# Patient Record
Sex: Female | Born: 1935 | Race: Black or African American | Hispanic: No | Marital: Single | Smoking: Never smoker
Health system: Southern US, Community
[De-identification: ages and names within clinical notes are randomized; demographics above are authoritative.]

## PROBLEM LIST (undated history)

## (undated) DIAGNOSIS — C801 Malignant (primary) neoplasm, unspecified: Secondary | ICD-10-CM

## (undated) DIAGNOSIS — F0390 Unspecified dementia without behavioral disturbance: Secondary | ICD-10-CM

## (undated) DIAGNOSIS — N182 Chronic kidney disease, stage 2 (mild): Secondary | ICD-10-CM

## (undated) DIAGNOSIS — K069 Disorder of gingiva and edentulous alveolar ridge, unspecified: Secondary | ICD-10-CM

## (undated) DIAGNOSIS — I639 Cerebral infarction, unspecified: Secondary | ICD-10-CM

## (undated) DIAGNOSIS — I10 Essential (primary) hypertension: Secondary | ICD-10-CM

## (undated) HISTORY — DX: Disorder of gingiva and edentulous alveolar ridge, unspecified: K06.9

## (undated) HISTORY — DX: Malignant (primary) neoplasm, unspecified: C80.1

---

## 2007-06-15 ENCOUNTER — Emergency Department: Payer: Self-pay | Admitting: Emergency Medicine

## 2007-06-15 ENCOUNTER — Other Ambulatory Visit: Payer: Self-pay

## 2011-02-11 HISTORY — PX: BREAST BIOPSY: SHX20

## 2011-11-08 ENCOUNTER — Emergency Department: Payer: Self-pay | Admitting: Emergency Medicine

## 2011-11-08 LAB — COMPREHENSIVE METABOLIC PANEL
Albumin: 3.5 g/dL (ref 3.4–5.0)
Alkaline Phosphatase: 181 U/L — ABNORMAL HIGH (ref 50–136)
Bilirubin,Total: 0.9 mg/dL (ref 0.2–1.0)
Chloride: 103 mmol/L (ref 98–107)
Co2: 30 mmol/L (ref 21–32)
Creatinine: 0.71 mg/dL (ref 0.60–1.30)
EGFR (Non-African Amer.): >60
Osmolality: 284 (ref 275–301)
SGOT(AST): 22 U/L (ref 15–37)
SGPT (ALT): 11 U/L — ABNORMAL LOW (ref 12–78)
Sodium: 142 mmol/L (ref 136–145)
Total Protein: 8.9 g/dL — ABNORMAL HIGH (ref 6.4–8.2)

## 2011-11-08 LAB — CBC
HGB: 13 g/dL (ref 12.0–16.0)
MCH: 29.4 pg (ref 26.0–34.0)
MCHC: 33.5 g/dL (ref 32.0–36.0)
Platelet: 244 10*3/uL (ref 150–440)
RBC: 4.41 10*6/uL (ref 3.80–5.20)
RDW: 14.5 % (ref 11.5–14.5)
WBC: 7.3 10*3/uL (ref 3.6–11.0)

## 2011-11-11 DIAGNOSIS — C801 Malignant (primary) neoplasm, unspecified: Secondary | ICD-10-CM

## 2011-11-11 HISTORY — DX: Malignant (primary) neoplasm, unspecified: C80.1

## 2012-01-11 HISTORY — PX: PORTACATH PLACEMENT: SHX2246

## 2012-02-11 HISTORY — PX: OTHER SURGICAL HISTORY: SHX169

## 2012-07-08 ENCOUNTER — Ambulatory Visit: Payer: Self-pay | Admitting: Hematology and Oncology

## 2012-07-13 ENCOUNTER — Ambulatory Visit: Payer: Self-pay | Admitting: Hematology and Oncology

## 2012-07-13 ENCOUNTER — Ambulatory Visit: Payer: Self-pay | Admitting: Radiation Oncology

## 2012-07-14 LAB — CBC CANCER CENTER
Eosinophil %: 3.4 %
Lymphocyte #: 1.3 x10 3/mm (ref 1.0–3.6)
Lymphocyte %: 16.5 %
MCHC: 33.7 g/dL (ref 32.0–36.0)
Monocyte #: 0.6 x10 3/mm (ref 0.2–0.9)
Monocyte %: 7.2 %
Neutrophil #: 5.5 x10 3/mm (ref 1.4–6.5)
Neutrophil %: 71.9 %
RDW: 14.2 % (ref 11.5–14.5)
WBC: 7.7 x10 3/mm (ref 3.6–11.0)

## 2012-07-14 LAB — COMPREHENSIVE METABOLIC PANEL
Albumin: 3.1 g/dL — ABNORMAL LOW (ref 3.4–5.0)
Alkaline Phosphatase: 141 U/L — ABNORMAL HIGH (ref 50–136)
Bilirubin,Total: 0.2 mg/dL (ref 0.2–1.0)
Calcium, Total: 9.5 mg/dL (ref 8.5–10.1)
Chloride: 104 mmol/L (ref 98–107)
Co2: 32 mmol/L (ref 21–32)
Creatinine: 1.26 mg/dL (ref 0.60–1.30)
EGFR (African American): 47 — ABNORMAL LOW
Osmolality: 283 (ref 275–301)
SGOT(AST): 18 U/L (ref 15–37)
SGPT (ALT): 14 U/L (ref 12–78)
Sodium: 140 mmol/L (ref 136–145)
Total Protein: 8.8 g/dL — ABNORMAL HIGH (ref 6.4–8.2)

## 2012-07-15 ENCOUNTER — Ambulatory Visit: Payer: Self-pay | Admitting: Hematology and Oncology

## 2012-07-20 ENCOUNTER — Ambulatory Visit (INDEPENDENT_AMBULATORY_CARE_PROVIDER_SITE_OTHER): Payer: Medicare Other | Admitting: General Surgery

## 2012-07-20 ENCOUNTER — Other Ambulatory Visit: Payer: Self-pay | Admitting: General Surgery

## 2012-07-20 ENCOUNTER — Encounter: Payer: Self-pay | Admitting: General Surgery

## 2012-07-20 VITALS — BP 148/74 | HR 60 | Resp 12 | Ht 61.0 in | Wt 107.0 lb

## 2012-07-20 DIAGNOSIS — C801 Malignant (primary) neoplasm, unspecified: Secondary | ICD-10-CM

## 2012-07-20 DIAGNOSIS — C50919 Malignant neoplasm of unspecified site of unspecified female breast: Secondary | ICD-10-CM

## 2012-07-20 DIAGNOSIS — C50911 Malignant neoplasm of unspecified site of right female breast: Secondary | ICD-10-CM

## 2012-07-20 NOTE — Progress Notes (Signed)
Patient ID: Sonya Bowman, female   DOB: [birth year], 79 y.o.   MRN: [id number]  Chief Complaint  Patient presents with  . Breast Problem    HPI Sonya Bowman is a 79 y.o. female who presents for an evaluation for right breast cancer diagnosed in Oct 2013. Referred by Dr Wendie Simmer.  Originally followed by Dr Wilford Grist [facility] and recently transferred to [facility] "closer for family".  Was trying chemotherapy to try and shrink the breast tumor but it doesn't seem to be working. Has left chest port a cath. Daughter present at visit. Daughter changing her right breast dressing every 2-3 days. Daughter does states her mom has had a weight loss of about 40 pounds over past year.  The patient's daughter, Sonya Bowman, had been unaware of the breast mass. Her mother had been living alone a couple of miles from her, but they are now living together. The daughter reports that her mother tolerated her chemotherapy well. Her weight loss predated the diagnosis of her cancer. The patient reports no difficulty with her appetite, but her daughter reports that she is eating less than she was a year or so ago. HPI  Past Medical History  Diagnosis Date  . Gum disease   . Cancer Oct 2013    right breast    Past Surgical History  Procedure Laterality Date  . Breast biopsy Right 2013  . Portacath placement Left Dec 2013    [facility]    History reviewed. No pertinent family history.  Social History History  Substance Use Topics  . Smoking status: Never Smoker   . Smokeless tobacco: Current User    Types: Snuff  . Alcohol Use: No    No Known Allergies  Current Outpatient Prescriptions  Medication Sig Dispense Refill  . cloNIDine (CATAPRES) 0.2 MG tablet Take 0.2 mg by mouth 2 (two) times daily.      Marland Kitchen loperamide (IMODIUM) 2 MG capsule Take 2 mg by mouth as needed for diarrhea or loose stools.      Bernadette Hoit Sodium (STOOL SOFTENER & LAXATIVE PO) Take by mouth.        No current facility-administered medications for this visit.    Review of Systems Review of Systems  Constitutional: Positive for chills.  Cardiovascular: Negative.     Blood pressure 148/74, pulse 60, resp. rate 12, height 5\' 1"  (1.549 m), weight 107 lb (48.535 kg).  Physical Exam Physical Exam  Constitutional: She is oriented to person, place, and time. She appears well-developed.  Cardiovascular: Normal rate and regular rhythm.   Pulmonary/Chest: Effort normal and breath sounds normal. Right breast exhibits mass. Left breast exhibits mass. Left breast exhibits no inverted nipple, no nipple discharge, no skin change and no tenderness.  Lymphadenopathy:    She has no cervical adenopathy.    She has axillary adenopathy.       Right axillary: Lateral adenopathy present.  Neurological: She is alert and oriented to person, place, and time.  Skin: Skin is warm and dry.   Left breast 5 mm nodule at 4 o'clock 9 cm right breast mass with a 1 cm skin ulceration at 10 o'clock with scant straw-colored drainage. Palpable right axillary nodes   Data Reviewed Pathology report reported stage IV breast cancer with posterior cervical adenopathy, low ER, low PR and not HER-2/neu over amplified. Bone scan was negative for metastatic bone disease.  Assessment    Advanced breast cancer with skin ulceration.     Plan  The indication for toilet mastectomy and lymphadenectomy to decrease her tumor volume was reviewed with the patient and her daughter. The risks of surgery associated with anesthesia, bleeding, infection pain were discussed. If she does as well with surgery as she has with her chemotherapy I would anticipate this will be completed as an outpatient procedure.     Patient's surgery has been scheduled for 07-29-12 at [facility].   Sonya Bowman 07/20/2012, 8:41 PM

## 2012-07-20 NOTE — Patient Instructions (Addendum)
The patient is aware to call back for any questions or concerns.  Right mastectomy same day surgery, risk and benefits reviewed.  Patient's surgery has been scheduled for 07-29-12 at [address] [facility].

## 2012-07-22 ENCOUNTER — Ambulatory Visit: Payer: Self-pay | Admitting: General Surgery

## 2012-07-26 ENCOUNTER — Ambulatory Visit: Payer: Self-pay | Admitting: Hematology and Oncology

## 2012-07-29 ENCOUNTER — Encounter: Payer: Self-pay | Admitting: General Surgery

## 2012-07-29 ENCOUNTER — Ambulatory Visit: Payer: Self-pay | Admitting: General Surgery

## 2012-07-29 DIAGNOSIS — C50419 Malignant neoplasm of upper-outer quadrant of unspecified female breast: Secondary | ICD-10-CM

## 2012-07-29 HISTORY — PX: BREAST SURGERY: SHX581

## 2012-08-03 ENCOUNTER — Ambulatory Visit (INDEPENDENT_AMBULATORY_CARE_PROVIDER_SITE_OTHER): Payer: Medicaid Other | Admitting: General Surgery

## 2012-08-03 ENCOUNTER — Encounter: Payer: Self-pay | Admitting: General Surgery

## 2012-08-03 VITALS — BP 118/66 | HR 62 | Resp 12 | Ht 61.0 in | Wt 106.0 lb

## 2012-08-03 DIAGNOSIS — C50911 Malignant neoplasm of unspecified site of right female breast: Secondary | ICD-10-CM

## 2012-08-03 DIAGNOSIS — C50919 Malignant neoplasm of unspecified site of unspecified female breast: Secondary | ICD-10-CM

## 2012-08-03 NOTE — Progress Notes (Signed)
Patient ID: Sonya Bowman, female   DOB: 1930-06-03, 79 y.o.   MRN: [id number]  Chief Complaint  Patient presents with  . Routine Post Op    right mastectomy    HPI Sonya Bowman is a 79 y.o. female here today for her post op right mastectomy done 07/29/12. No complaints and states "doing good".  She is accompanied today by her daughter. Drainage volumes are still running 45-60 cc per day. The patient reports essentially no pain.  HPI  Past Medical History  Diagnosis Date  . Gum disease   . Cancer Oct 2013    right breast    Past Surgical History  Procedure Laterality Date  . Portacath placement Left Dec 2013    [facility]  . Breast biopsy Right 2013  . Breast surgery Right 2014    mastectomy    No family history on file.  Social History History  Substance Use Topics  . Smoking status: Never Smoker   . Smokeless tobacco: Current User    Types: Snuff  . Alcohol Use: No    No Known Allergies  Current Outpatient Prescriptions  Medication Sig Dispense Refill  . cloNIDine (CATAPRES) 0.2 MG tablet Take 0.2 mg by mouth 2 (two) times daily.      Marland Kitchen loperamide (IMODIUM) 2 MG capsule Take 2 mg by mouth as needed for diarrhea or loose stools.      Bernadette Hoit Sodium (STOOL SOFTENER & LAXATIVE PO) Take by mouth.       No current facility-administered medications for this visit.    Review of Systems Review of Systems  Constitutional: Negative.   Respiratory: Negative.   Cardiovascular: Negative.     Blood pressure 118/66, pulse 62, resp. rate 12, height 5\' 1"  (1.549 m), weight 106 lb (48.081 kg).  Physical Exam Physical Exam  Constitutional: She is oriented to person, place, and time. She appears well-developed and well-nourished.  Pulmonary/Chest:  Right mastectomy healing well.  Neurological: She is alert and oriented to person, place, and time.  Skin: Skin is warm and dry.  Right shoulder range of motion is quite good. No evidence of peripheral edema in  the upper extremity.  Data Reviewed Pathology showed invasive mammary carcinoma no special type measuring 9.5 cm in greatest diameter with skin ulceration. Histologic grade 3. 15 lymph nodes negative for malignancy, 3 of 15 lymph nodes with extensive necrosis consistent with treatment response. yP4b,ypN0  Assessment    Doing well post right modified radical mastectomy.    Plan    Arrangements are in place for followup exam in one week. A Tegaderm dressing was applied to the wound. The patient may shower but was discouraged from soaking the chest in the tub.       Sonya Bowman 08/04/2012, 7:33 PM

## 2012-08-03 NOTE — Patient Instructions (Addendum)
Patient to return one week  

## 2012-08-04 ENCOUNTER — Encounter: Payer: Self-pay | Admitting: General Surgery

## 2012-08-10 ENCOUNTER — Ambulatory Visit: Payer: Self-pay | Admitting: Hematology and Oncology

## 2012-08-10 ENCOUNTER — Ambulatory Visit: Payer: Self-pay | Admitting: Radiation Oncology

## 2012-08-10 LAB — CBC CANCER CENTER
Eosinophil #: 0.4 x10 3/mm (ref 0.0–0.7)
HCT: 34.8 % — ABNORMAL LOW (ref 35.0–47.0)
Lymphocyte %: 15.5 %
MCHC: 33.9 g/dL (ref 32.0–36.0)
MCV: 88 fL (ref 80–100)
Neutrophil #: 5.4 x10 3/mm (ref 1.4–6.5)
Neutrophil %: 70.7 %
RBC: 3.95 10*6/uL (ref 3.80–5.20)

## 2012-08-10 LAB — COMPREHENSIVE METABOLIC PANEL
Albumin: 3.1 g/dL — ABNORMAL LOW (ref 3.4–5.0)
Anion Gap: 4 — ABNORMAL LOW (ref 7–16)
BUN: 19 mg/dL — ABNORMAL HIGH (ref 7–18)
Calcium, Total: 9.1 mg/dL (ref 8.5–10.1)
EGFR (African American): 59 — ABNORMAL LOW
Glucose: 83 mg/dL (ref 65–99)
Osmolality: 288 (ref 275–301)
Potassium: 4 mmol/L (ref 3.5–5.1)
SGPT (ALT): 14 U/L (ref 12–78)
Total Protein: 7.7 g/dL (ref 6.4–8.2)

## 2012-08-11 ENCOUNTER — Ambulatory Visit (INDEPENDENT_AMBULATORY_CARE_PROVIDER_SITE_OTHER): Payer: Medicaid Other | Admitting: General Surgery

## 2012-08-11 ENCOUNTER — Encounter: Payer: Self-pay | Admitting: General Surgery

## 2012-08-11 VITALS — BP 120/68 | HR 64 | Resp 14 | Ht 61.0 in | Wt 110.0 lb

## 2012-08-11 DIAGNOSIS — C50911 Malignant neoplasm of unspecified site of right female breast: Secondary | ICD-10-CM

## 2012-08-11 DIAGNOSIS — C50919 Malignant neoplasm of unspecified site of unspecified female breast: Secondary | ICD-10-CM

## 2012-08-11 NOTE — Progress Notes (Deleted)
Patient ID: Sonya Bowman, female   DOB: Oct 30, 1930, 79 y.o.   MRN: [id number]  Chief Complaint  Patient presents with  . Routine Post Op    right breast mastectomy    HPI Sonya Bowman is a 79 y.o. female here today for her post op right breast mastectomy done on 07/29/12.  HPI  Past Medical History  Diagnosis Date  . Gum disease   . Cancer Oct 2013    right breast    Past Surgical History  Procedure Laterality Date  . Portacath placement Left Dec 2013    [facility]  . Breast biopsy Right 2013  . Breast surgery Right 2014    mastectomy    No family history on file.  Social History History  Substance Use Topics  . Smoking status: Never Smoker   . Smokeless tobacco: Current User    Types: Snuff  . Alcohol Use: No    No Known Allergies  Current Outpatient Prescriptions  Medication Sig Dispense Refill  . cloNIDine (CATAPRES) 0.2 MG tablet Take 0.2 mg by mouth 2 (two) times daily.      Marland Kitchen loperamide (IMODIUM) 2 MG capsule Take 2 mg by mouth as needed for diarrhea or loose stools.      Bernadette Hoit Sodium (STOOL SOFTENER & LAXATIVE PO) Take by mouth.       No current facility-administered medications for this visit.    Review of Systems Review of Systems  Constitutional: Negative.   Respiratory: Negative.   Cardiovascular: Negative.     There were no vitals taken for this visit.  Physical Exam Physical Exam  Data Reviewed ***  Assessment    ***    Plan    ***       Ples Specter 08/11/2012, 2:51 PM

## 2012-08-11 NOTE — Progress Notes (Signed)
Patient ID: Sonya Bowman, female   DOB: 1930-10-05, 79 y.o.   MRN: [id number]  Chief Complaint  Patient presents with  . Routine Post Op    right breast mastectomy    HPI Sonya Bowman is a 79 y.o. female who presents for an 8 day post op right breast modified radical mastectomy. The procedure was performed on 07/29/12. Patient states she is doing well. HPI  Past Medical History  Diagnosis Date  . Gum disease   . Cancer Oct 2013    right breast    Past Surgical History  Procedure Laterality Date  . Portacath placement Left Dec 2013    [facility]  . Breast biopsy Right 2013  . Breast surgery Right 2014    mastectomy    No family history on file.  Social History History  Substance Use Topics  . Smoking status: Never Smoker   . Smokeless tobacco: Current User    Types: Snuff  . Alcohol Use: No    No Known Allergies  Current Outpatient Prescriptions  Medication Sig Dispense Refill  . cloNIDine (CATAPRES) 0.2 MG tablet Take 0.2 mg by mouth 2 (two) times daily.      Marland Kitchen loperamide (IMODIUM) 2 MG capsule Take 2 mg by mouth as needed for diarrhea or loose stools.      Bernadette Hoit Sodium (STOOL SOFTENER & LAXATIVE PO) Take by mouth.       No current facility-administered medications for this visit.    Review of Systems Review of Systems  Constitutional: Negative.   Respiratory: Negative.   Cardiovascular: Negative.     Blood pressure 120/68, pulse 64, resp. rate 14, height 5\' 1"  (1.549 m), weight 110 lb (49.896 kg).  Physical Exam Physical Exam  Constitutional: She is oriented to person, place, and time. She appears well-nourished.  Pulmonary/Chest:  Right mastectomy looks clean and healing well.  Neurological: She is alert and oriented to person, place, and time.  Skin: Skin is warm and dry.  No evidence of inflammation or induration at the Hopkins drain exit site. The area was cleaned and a Tegaderm dressing applied.  Data Reviewed Drainage volumes are  still running 45-60 cc per day.  Assessment    Doing well status post right salvage mastectomy.     Plan    We'll plan for a followup examination in one week.        Sonya Bowman 08/11/2012, 9:20 PM

## 2012-08-17 ENCOUNTER — Ambulatory Visit (INDEPENDENT_AMBULATORY_CARE_PROVIDER_SITE_OTHER): Payer: Medicaid Other | Admitting: General Surgery

## 2012-08-17 ENCOUNTER — Encounter: Payer: Self-pay | Admitting: General Surgery

## 2012-08-17 VITALS — BP 148/78 | HR 74 | Resp 16 | Ht 60.0 in | Wt 108.0 lb

## 2012-08-17 DIAGNOSIS — C50919 Malignant neoplasm of unspecified site of unspecified female breast: Secondary | ICD-10-CM

## 2012-08-17 DIAGNOSIS — C50911 Malignant neoplasm of unspecified site of right female breast: Secondary | ICD-10-CM

## 2012-08-17 NOTE — Patient Instructions (Addendum)
Continue self breast exams. Call office for any new breast issues or concerns. follow up at [facility] as scheduled

## 2012-08-17 NOTE — Progress Notes (Signed)
Patient ID: Sonya Bowman, female   DOB: January 03, 1931, 79 y.o.   MRN: [id number]  Chief Complaint  Patient presents with  . Follow-up    right mastectomy    HPI Sonya Bowman is a 79 y.o. female.  Patient here today for postop visit from right mastectomy done 07-29-12. JP drain record sheet present. States not as much pain, just "soreness". HPI  Past Medical History  Diagnosis Date  . Gum disease   . Cancer Oct 2013    right breast    Past Surgical History  Procedure Laterality Date  . Portacath placement Left Dec 2013    [facility]  . Breast biopsy Right 2013  . Breast surgery Right July 29 2012    mastectomy    No family history on file.  Social History History  Substance Use Topics  . Smoking status: Never Smoker   . Smokeless tobacco: Current User    Types: Snuff  . Alcohol Use: No    No Known Allergies  Current Outpatient Prescriptions  Medication Sig Dispense Refill  . cloNIDine (CATAPRES) 0.2 MG tablet Take 0.2 mg by mouth 2 (two) times daily.      Marland Kitchen loperamide (IMODIUM) 2 MG capsule Take 2 mg by mouth as needed for diarrhea or loose stools.      Bernadette Hoit Sodium (STOOL SOFTENER & LAXATIVE PO) Take by mouth.       No current facility-administered medications for this visit.    Review of Systems Review of Systems  Constitutional: Negative.   Respiratory: Negative.   Cardiovascular: Negative.     Blood pressure 148/78, pulse 74, resp. rate 16, height 5' (1.524 m), weight 108 lb (48.988 kg).  Physical Exam Physical Exam  Constitutional: She is oriented to person, place, and time. She appears well-developed.  Neurological: She is alert and oriented to person, place, and time.  Skin: Skin is warm and dry.   ROM improving JP drain removed 2x2 and Tegaderm applied Data Reviewed Drain volume has slowly decreased since last week's visit and is now averaging about 30 cc per day. The drain was removed without incident.  Assessment    Doing well  status post mastectomy.     Plan    The patient and her daughter were advised that she may cannulate a small amount of fluid out of the drains been removed. They were reassured that this does not related to any recurrence of the malignancy.  He'll return next week after CT simulation is completed for postmastectomy radiation.        [name] 08/17/2012, 8:27 PM

## 2012-08-23 ENCOUNTER — Ambulatory Visit (INDEPENDENT_AMBULATORY_CARE_PROVIDER_SITE_OTHER): Payer: Medicaid Other | Admitting: *Deleted

## 2012-08-23 DIAGNOSIS — C50911 Malignant neoplasm of unspecified site of right female breast: Secondary | ICD-10-CM

## 2012-08-23 DIAGNOSIS — C50919 Malignant neoplasm of unspecified site of unspecified female breast: Secondary | ICD-10-CM

## 2012-08-23 NOTE — Progress Notes (Signed)
Patient right mastectomy scar healing well.No sign of swelling or fluid .Patient to return in two months.

## 2012-09-10 ENCOUNTER — Ambulatory Visit: Payer: Self-pay | Admitting: Radiation Oncology

## 2012-09-10 ENCOUNTER — Ambulatory Visit: Payer: Self-pay | Admitting: Hematology and Oncology

## 2012-09-17 LAB — CBC CANCER CENTER
Eosinophil #: 0.4 x10 3/mm (ref 0.0–0.7)
HCT: 37.4 % (ref 35.0–47.0)
Lymphocyte %: 8.2 %
MCH: 30.4 pg (ref 26.0–34.0)
MCV: 88 fL (ref 80–100)
Monocyte %: 7.8 %
Neutrophil #: 5.2 x10 3/mm (ref 1.4–6.5)
Neutrophil %: 77.9 %
Platelet: 232 x10 3/mm (ref 150–440)
RDW: 15.1 % — ABNORMAL HIGH (ref 11.5–14.5)

## 2012-09-23 LAB — CBC CANCER CENTER
Basophil #: 0 x10 3/mm (ref 0.0–0.1)
Eosinophil #: 0.3 x10 3/mm (ref 0.0–0.7)
Eosinophil %: 5.1 %
HGB: 12.2 g/dL (ref 12.0–16.0)
Lymphocyte #: 0.6 x10 3/mm — ABNORMAL LOW (ref 1.0–3.6)
MCH: 30.3 pg (ref 26.0–34.0)
MCV: 89 fL (ref 80–100)
Neutrophil #: 4.4 x10 3/mm (ref 1.4–6.5)
Platelet: 208 x10 3/mm (ref 150–440)
RDW: 14.8 % — ABNORMAL HIGH (ref 11.5–14.5)
WBC: 5.8 x10 3/mm (ref 3.6–11.0)

## 2012-09-23 LAB — BASIC METABOLIC PANEL
Anion Gap: 7 (ref 7–16)
BUN: 19 mg/dL — ABNORMAL HIGH (ref 7–18)
Calcium, Total: 9.2 mg/dL (ref 8.5–10.1)
Co2: 30 mmol/L (ref 21–32)
Glucose: 97 mg/dL (ref 65–99)
Potassium: 4.1 mmol/L (ref 3.5–5.1)

## 2012-10-01 LAB — CBC CANCER CENTER
Basophil #: 0 x10 3/mm (ref 0.0–0.1)
Basophil %: 0.9 %
Eosinophil #: 0.4 x10 3/mm (ref 0.0–0.7)
HCT: 36.2 % (ref 35.0–47.0)
HGB: 12.4 g/dL (ref 12.0–16.0)
Lymphocyte #: 0.5 x10 3/mm — ABNORMAL LOW (ref 1.0–3.6)
Lymphocyte %: 10.1 %
MCV: 89 fL (ref 80–100)
Monocyte %: 9.7 %
Neutrophil #: 3.9 x10 3/mm (ref 1.4–6.5)
Neutrophil %: 72.5 %
Platelet: 217 x10 3/mm (ref 150–440)
RBC: 4.08 10*6/uL (ref 3.80–5.20)

## 2012-10-11 ENCOUNTER — Ambulatory Visit: Payer: Self-pay | Admitting: Radiation Oncology

## 2012-10-11 ENCOUNTER — Ambulatory Visit: Payer: Self-pay | Admitting: Hematology and Oncology

## 2012-10-15 LAB — CBC CANCER CENTER
Basophil %: 0.9 %
Eosinophil #: 0.3 x10 3/mm (ref 0.0–0.7)
Eosinophil %: 4.8 %
HGB: 11.6 g/dL — ABNORMAL LOW (ref 12.0–16.0)
Lymphocyte #: 0.4 x10 3/mm — ABNORMAL LOW (ref 1.0–3.6)
Lymphocyte %: 6.9 %
MCH: 30 pg (ref 26.0–34.0)
MCHC: 33.8 g/dL (ref 32.0–36.0)
MCV: 89 fL (ref 80–100)
Monocyte #: 0.7 x10 3/mm (ref 0.2–0.9)
Neutrophil #: 4 x10 3/mm (ref 1.4–6.5)
Platelet: 202 x10 3/mm (ref 150–440)
RBC: 3.85 10*6/uL (ref 3.80–5.20)
WBC: 5.4 x10 3/mm (ref 3.6–11.0)

## 2012-11-03 ENCOUNTER — Ambulatory Visit (INDEPENDENT_AMBULATORY_CARE_PROVIDER_SITE_OTHER): Payer: Medicare Other | Admitting: General Surgery

## 2012-11-03 ENCOUNTER — Encounter: Payer: Self-pay | Admitting: General Surgery

## 2012-11-03 ENCOUNTER — Other Ambulatory Visit: Payer: Medicare Other

## 2012-11-03 VITALS — BP 124/80 | HR 78 | Resp 12 | Ht 61.0 in | Wt 109.0 lb

## 2012-11-03 DIAGNOSIS — C50911 Malignant neoplasm of unspecified site of right female breast: Secondary | ICD-10-CM

## 2012-11-03 DIAGNOSIS — N6320 Unspecified lump in the left breast, unspecified quadrant: Secondary | ICD-10-CM | POA: Insufficient documentation

## 2012-11-03 DIAGNOSIS — N63 Unspecified lump in unspecified breast: Secondary | ICD-10-CM

## 2012-11-03 DIAGNOSIS — C50919 Malignant neoplasm of unspecified site of unspecified female breast: Secondary | ICD-10-CM | POA: Insufficient documentation

## 2012-11-03 NOTE — Progress Notes (Signed)
Patient ID: Sonya Bowman, female   DOB: [birth year], 79 y.o.   MRN: [id number]  Chief Complaint  Patient presents with  . Follow-up    breast cancer    HPI Sonya Bowman is a 78 y.o. female here today following up for her two month breast cancer and left power port placement. Patient states she is doing well. Patient radiation treatment treatment was completed on 10/27/12. The patient reports minimal discomfort over the mastectomy site. No new problems. HPI  Past Medical History  Diagnosis Date  . Gum disease   . Cancer Oct 2013    right breast    Past Surgical History  Procedure Laterality Date  . Portacath placement Left Dec 2013    [facility]  . Breast biopsy Right 2013  . Breast surgery Right July 29 2012    mastectomy  . Power port placement  2014    No family history on file.  Social History History  Substance Use Topics  . Smoking status: Never Smoker   . Smokeless tobacco: Current User    Types: Snuff  . Alcohol Use: No    No Known Allergies  Current Outpatient Prescriptions  Medication Sig Dispense Refill  . cloNIDine (CATAPRES) 0.2 MG tablet Take 0.2 mg by mouth 2 (two) times daily.      Marland Kitchen loperamide (IMODIUM) 2 MG capsule Take 2 mg by mouth as needed for diarrhea or loose stools.      Bernadette Hoit Sodium (STOOL SOFTENER & LAXATIVE PO) Take by mouth.       No current facility-administered medications for this visit.    Review of Systems Review of Systems  Constitutional: Negative.   Respiratory: Negative.   Cardiovascular: Negative.     Blood pressure 124/80, pulse 78, resp. rate 12, height 5\' 1"  (1.549 m), weight 109 lb (49.442 kg).  Physical Exam Physical Exam  Constitutional: She is oriented to person, place, and time. She appears well-developed and well-nourished.  Pulmonary/Chest:  Right mastectomy well healed scar with extensive discoloration from her radiation therapy. There is a area of redundant skin of the right axilla measuring  2.5 x 5 cm diameter. No tenderness in spite of the significant skin changes from radiation. There is some desquamation noted along the chest wall.  Lymphadenopathy:    She has no cervical adenopathy.  Neurological: She is alert and oriented to person, place, and time.  Skin: Skin is warm and dry.    Data Reviewed The patient presented with a large mass in the right breast and a PET scan was obtained during her metastatic workup. On review the films there is a calcified nodule left breast corresponding to that identified on clinical exam. This was PET negative.  Ultrasound examination of the left breast mass in the 4:00 position, 6 cm from the nipple shows a well-circumscribed hypoechoic nodule with areas suggestive of calcification. This measured 0.61 x 0.66 x 0.76 cm. 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 The patient was amenable to FNA sampling. This was completed using 1 cc of 1% plain Xylocaine. Under ultrasound guidance multiple passes to the lesion were completed and slides prepared for cytologic review. The procedure was well tolerated. Her  Assessment    Doing well status post treatment of advanced right breast cancer. Left breast mass, likely fibroadenoma.     Plan    The patient will be contact with the cytology results are available. The patient will make use of cocoa butter to the skin of the chest wall.  Sonya Bowman 11/03/2012, 9:22 PM

## 2012-11-03 NOTE — Patient Instructions (Signed)
Patient to return in six months  

## 2012-11-05 LAB — FINE-NEEDLE ASPIRATION

## 2012-11-10 ENCOUNTER — Ambulatory Visit: Payer: Self-pay | Admitting: Hematology and Oncology

## 2012-12-11 ENCOUNTER — Ambulatory Visit: Payer: Self-pay | Admitting: Hematology and Oncology

## 2013-03-03 ENCOUNTER — Ambulatory Visit: Payer: Self-pay | Admitting: Hematology and Oncology

## 2013-03-13 ENCOUNTER — Ambulatory Visit: Payer: Self-pay | Admitting: Hematology and Oncology

## 2013-03-21 ENCOUNTER — Encounter: Payer: Self-pay | Admitting: General Surgery

## 2013-04-20 ENCOUNTER — Ambulatory Visit: Payer: Self-pay | Admitting: Hematology and Oncology

## 2013-04-21 LAB — CBC CANCER CENTER
BASOS ABS: 0 x10 3/mm (ref 0.0–0.1)
Basophil %: 0.9 %
EOS PCT: 3.3 %
Eosinophil #: 0.2 x10 3/mm (ref 0.0–0.7)
HCT: 37.1 % (ref 35.0–47.0)
HGB: 11.9 g/dL — ABNORMAL LOW (ref 12.0–16.0)
LYMPHS ABS: 0.7 x10 3/mm — AB (ref 1.0–3.6)
Lymphocyte %: 13.8 %
MCH: 28.8 pg (ref 26.0–34.0)
MCHC: 32 g/dL (ref 32.0–36.0)
MCV: 90 fL (ref 80–100)
MONO ABS: 0.4 x10 3/mm (ref 0.2–0.9)
Monocyte %: 6.6 %
NEUTROS ABS: 4.1 x10 3/mm (ref 1.4–6.5)
Neutrophil %: 75.4 %
Platelet: 221 x10 3/mm (ref 150–440)
RBC: 4.12 10*6/uL (ref 3.80–5.20)
RDW: 14.4 % (ref 11.5–14.5)
WBC: 5.4 x10 3/mm (ref 3.6–11.0)

## 2013-04-21 LAB — COMPREHENSIVE METABOLIC PANEL
ALBUMIN: 3.1 g/dL — AB (ref 3.4–5.0)
ALT: 12 U/L (ref 12–78)
ANION GAP: 6 — AB (ref 7–16)
AST: 15 U/L (ref 15–37)
Alkaline Phosphatase: 148 U/L — ABNORMAL HIGH
BUN: 19 mg/dL — AB (ref 7–18)
Bilirubin,Total: 0.8 mg/dL (ref 0.2–1.0)
CO2: 33 mmol/L — AB (ref 21–32)
CREATININE: 1.05 mg/dL (ref 0.60–1.30)
Calcium, Total: 8.9 mg/dL (ref 8.5–10.1)
Chloride: 105 mmol/L (ref 98–107)
EGFR (African American): 58 — ABNORMAL LOW
GFR CALC NON AF AMER: 50 — AB
GLUCOSE: 142 mg/dL — AB (ref 65–99)
OSMOLALITY: 292 (ref 275–301)
POTASSIUM: 3.6 mmol/L (ref 3.5–5.1)
Sodium: 144 mmol/L (ref 136–145)
Total Protein: 7.5 g/dL (ref 6.4–8.2)

## 2013-05-03 ENCOUNTER — Ambulatory Visit: Payer: Self-pay | Admitting: General Surgery

## 2013-05-09 ENCOUNTER — Encounter: Payer: Self-pay | Admitting: General Surgery

## 2013-05-10 ENCOUNTER — Ambulatory Visit (INDEPENDENT_AMBULATORY_CARE_PROVIDER_SITE_OTHER): Payer: Medicare Other | Admitting: General Surgery

## 2013-05-10 ENCOUNTER — Encounter: Payer: Self-pay | Admitting: General Surgery

## 2013-05-10 VITALS — BP 158/88 | HR 78 | Resp 12 | Ht 61.0 in | Wt 110.0 lb

## 2013-05-10 DIAGNOSIS — C50919 Malignant neoplasm of unspecified site of unspecified female breast: Secondary | ICD-10-CM

## 2013-05-10 DIAGNOSIS — C50911 Malignant neoplasm of unspecified site of right female breast: Secondary | ICD-10-CM

## 2013-05-10 NOTE — Patient Instructions (Signed)
Continue self breast exams. Call office for any new breast issues or concerns. 

## 2013-05-10 NOTE — Progress Notes (Signed)
Patient ID: Sonya Bowman, female   DOB: [birth year], 80 y.o.   MRN: [id number]  Chief Complaint  Patient presents with  . Follow-up    mammogram    HPI Sonya Bowman is a 80 y.o. female who presents for a breast evaluation. The most recent mammogram was done on 05/07/13.  Patient does perform regular self breast checks and gets regular mammograms done.  No new breast complaints. She does state the right chest wall has some tenderness when she sleeps on that side. The patient is not troubled by any swelling in her arm, nor has she appreciated any limitation of shoulder motion status post modified radical mastectomy. Tolerating the Femara. Appointment with [facility] is for August 2015.  HPI  Past Medical History  Diagnosis Date  . Gum disease   . Cancer Oct 2013    ypT4b, ypN0, 9.5 cm right breast tumor, 3/18 nodes showing effective neoadjuvant chemotherapy. ER 30%, PR 0%, HER-2/neu negative.    Past Surgical History  Procedure Laterality Date  . Portacath placement Left Dec 2013    [facility]  . Power port placement  2014  . Breast biopsy Right 2013  . Breast surgery Right July 29 2012    Modified radical mastectomy    No family history on file.  Social History History  Substance Use Topics  . Smoking status: Never Smoker   . Smokeless tobacco: Current User    Types: Snuff  . Alcohol Use: No    No Known Allergies  Current Outpatient Prescriptions  Medication Sig Dispense Refill  . Docusate Calcium (STOOL SOFTENER PO) Take by mouth as needed.      Marland Kitchen letrozole (FEMARA) 2.5 MG tablet        No current facility-administered medications for this visit.    Review of Systems Review of Systems  Constitutional: Negative.   Respiratory: Negative.   Cardiovascular: Negative.     Blood pressure 158/88, pulse 78, resp. rate 12, height 5' 1"  (1.549 m), weight 110 lb (49.896 kg).  Physical Exam Physical Exam  Constitutional: She is oriented to person, place, and time.  She appears well-developed and well-nourished.  Neck: Neck supple.  Cardiovascular: Normal rate, regular rhythm and normal heart sounds.   Pulmonary/Chest: Effort normal and breath sounds normal. Left breast exhibits mass. Left breast exhibits no inverted nipple, no nipple discharge, no skin change and no tenderness.  4 o'clock 5 CFN 6 mm nodule left breast. This is felt to correspond with a calcified fibroadenoma on mammography to 3 x 5 cm area of redundant skin laterally right mastectomy site.  Musculoskeletal:  Measurements of the upper extremities were completed at a location 15 cm superior as well as tendon 20 cm inferior to the olecranon process.  On the left side these measurements were 28, 22.5 and 17.  On the right side these measurements were 28, 23.5 and 17. Minimal proximal forearm asymmetry.  Lymphadenopathy:    She has no cervical adenopathy.    She has no axillary adenopathy.  No upper extremity edema noted  Neurological: She is alert and oriented to person, place, and time.  Skin: Skin is warm and dry.    Data Reviewed Left unilateral mammogram dated 05/03/2013 showed no interval change.  Assessment    Doing well status post right modified radical mastectomy post neoadjuvant chemotherapy.    Plan    The patient will continue ongoing followup with medical oncology. She's been asked to return here in one year for reassessment.  One year left screening mammogram at [facility] and office visit.   Dr. Bufford Lope, [place] 05/11/2013, 5:10 PM

## 2013-05-11 ENCOUNTER — Encounter: Payer: Self-pay | Admitting: General Surgery

## 2013-05-11 ENCOUNTER — Ambulatory Visit: Payer: Self-pay | Admitting: Hematology and Oncology

## 2013-05-11 DIAGNOSIS — C50911 Malignant neoplasm of unspecified site of right female breast: Secondary | ICD-10-CM | POA: Insufficient documentation

## 2013-09-21 ENCOUNTER — Ambulatory Visit: Payer: Self-pay | Admitting: Radiation Oncology

## 2013-12-12 ENCOUNTER — Encounter: Payer: Self-pay | Admitting: General Surgery

## 2014-05-05 ENCOUNTER — Ambulatory Visit: Payer: Self-pay | Admitting: General Surgery

## 2014-05-08 ENCOUNTER — Encounter: Payer: Self-pay | Admitting: General Surgery

## 2014-05-16 ENCOUNTER — Ambulatory Visit (INDEPENDENT_AMBULATORY_CARE_PROVIDER_SITE_OTHER): Payer: Medicare Other | Admitting: General Surgery

## 2014-05-16 VITALS — BP 120/70 | HR 72 | Resp 14 | Ht 61.0 in | Wt 117.0 lb

## 2014-05-16 DIAGNOSIS — N63 Unspecified lump in breast: Secondary | ICD-10-CM

## 2014-05-16 DIAGNOSIS — C50911 Malignant neoplasm of unspecified site of right female breast: Secondary | ICD-10-CM

## 2014-05-16 DIAGNOSIS — N6320 Unspecified lump in the left breast, unspecified quadrant: Secondary | ICD-10-CM

## 2014-05-16 NOTE — Patient Instructions (Signed)
The patient has been asked to return to the office in one year with a left screening mammogram. 

## 2014-05-16 NOTE — Progress Notes (Signed)
Patient ID: Sonya Bowman, female   DOB: 05-07-1930, 81 y.o.   MRN: [id number]  Chief Complaint  Patient presents with  . Follow-up    mammogram    HPI Sonya Bowman is a 81 y.o. female who presents for a breast evaluation. The most recent left breast  mammogram was done on 05/05/14.  Patient does perform regular self breast checks and gets regular mammograms done.  The patient reports no difficulty with shoulder range of motion and is not aware of any asymmetry or swelling in her arms.  HPI  Past Medical History  Diagnosis Date  . Gum disease   . Cancer Oct 2013    ypT4b, ypN0, 9.5 cm right breast tumor, 3/18 nodes showing effective neoadjuvant chemotherapy. ER 30%, PR 0%, HER-2/neu negative.    Past Surgical History  Procedure Laterality Date  . Portacath placement Left Dec 2013    [facility]  . Power port placement  2014  . Breast biopsy Right 2013  . Breast surgery Right July 29 2012    Modified radical mastectomy    No family history on file.  Social History History  Substance Use Topics  . Smoking status: Never Smoker   . Smokeless tobacco: Current User    Types: Snuff  . Alcohol Use: No    No Known Allergies  No current outpatient prescriptions on file.   No current facility-administered medications for this visit.    Review of Systems Review of Systems  Constitutional: Negative.   Respiratory: Negative.   Cardiovascular: Negative.     Blood pressure 120/70, pulse 72, resp. rate 14, height _0  (1.549 m), weight 117 lb (53.071 kg).  Physical Exam Physical Exam  Constitutional: She is oriented to person, place, and time. She appears well-developed and well-nourished.  Eyes: Conjunctivae are normal. No scleral icterus.  Neck: Neck supple.  Cardiovascular: Normal rate, regular rhythm and normal heart sounds.   Pulmonary/Chest: Effort normal and breath sounds normal. Left breast exhibits no inverted nipple, no mass, no nipple discharge, no skin  change and no tenderness.  Right mastectomy site well healed .   Abdominal: Soft. Bowel sounds are normal. There is no tenderness.  Lymphadenopathy:    She has no cervical adenopathy.  Neurological: She is alert and oriented to person, place, and time.  Skin: Skin is warm and dry.   March 2015 upper extremity measurements: On the left side these measurements were 28, 22.5 and 17. 2016 measurements: 31, 25 and 18.5 respectively   On the right side these measurements were 28, 23.5 and 17. Minimal proximal forearm asymmetry.  2016 measurements: 29, 23 and 17 cm respectively.  Taking into account mild upper extremity increases bilaterally, 2 cm increase over the past year.  Data Reviewed Left breast screening mammogram dated 05/05/2014 showed a calcified fibroadenoma. Otherwise unremarkable. BI-RADS-1.  Assessment    Benign breast exam.  Mild progressive upper extremity edema, asymptomatic.    Plan    The patient has been asked to return to the office in one year with a left screening mammogram.  We'll recheck her upper extremity measurements at follow-up.       Robert Bellow 05/17/2014, 5:07 PM

## 2014-06-02 NOTE — Consult Note (Signed)
Reason for Visit: This 81 year old Female patient presents to the clinic for initial evaluation of  breast cancer .   Referred by Dr. Kallie Edward.  Diagnosis:  Chief Complaint/Diagnosis   81 year old female with stage IV based on cervical lymph node involvement breast cancer clinical stage TIV, N3, M1  Pathology Report pathology report reviewed   Imaging Report PET/CT scan and MRI scan of the brain ordered and pending   Referral Report clinical notes reviewed   Planned Treatment Regimen clinicalmanagement of locally advanced right breast cancer   HPI   patient is in 81 year old femalewho presented into their thousand 40 the locally advanced right breast cancer measuring approximately 8 cm. Workup at that time confirmed multiple involved axillary supraclavicular lymph nodes as well as an abnormal right posterior cervical lymph node considered M1 disease. Started on weekly Taxol in December 2013although has been lost to followup care at [facility]. She did have some associated peripheral neuropathy with her Taxol. She's been under no treatment since February 2014 and is in recently been seen for locally advanced right breast cancer. No evidence of skin involvement at this time ulceration or significant pain. Her tumor was slightly ER positive PR negative HER-2/neu not overexpressed. She is an MRI of the brain and PET CT ordered and they're pending at this time. I been asked to evaluate the patient for possible palliative radiation therapy to her right breast.  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   Family History noncontributory   Social History noncontributory   Additional Past Medical and Surgical History  accompanied by daughter today   Allergies:   No Known Allergies:   Home Meds:  Home Medications: Medication Instructions Status  Catapres 0.2 mg oral tablet 1 tab(s) orally 2 times a day Active   Review of Systems:  General negative   Performance Status (ECOG) 0   Skin negative   Breast see HPI   Ophthalmologic negative   ENMT negative   Respiratory and Thorax negative   Cardiovascular negative   Gastrointestinal negative   Genitourinary negative   Musculoskeletal negative   Neurological negative   Psychiatric negative   Hematology/Lymphatics negative   Endocrine negative   Allergic/Immunologic negative   Review of Systems   according to nurse's notesPatient denies any weight loss, fatigue, weakness, fever, chills or night sweats. Patient denies any loss of vision, blurred vision. Patient denies any ringing  of the ears or hearing loss. No irregular heartbeat. Patient denies heart murmur or history of fainting. Patient denies any chest pain or pain radiating to her upper extremities. Patient denies any shortness of breath, difficulty breathing at night, cough or hemoptysis. Patient denies any swelling in the lower legs. Patient denies any nausea vomiting, vomiting of blood, or coffee ground material in the vomitus. Patient denies any stomach pain. Patient states has had normal bowel movements no significant constipation or diarrhea. Patient denies any dysuria, hematuria or significant nocturia. Patient denies any problems walking, swelling in the joints or loss of balance. Patient denies any skin changes, loss of hair or loss of weight. Patient denies any excessive worrying or anxiety or significant depression. Patient denies any problems with insomnia. Patient denies excessive thirst, polyuria, polydipsia. Patient denies any swollen glands, patient denies  easy bruising or easy bleeding. Patient denies any recent infections, allergies or URI. Patient "s visual fields have  not changed significantly in recent time.  Nursing Notes:  Nursing Vital Signs and Chemo Nursing Nursing Notes: *CC Vital Signs Flowsheet:   04-Jun-14 10:58  Temp Temperature 98.7  Pulse Pulse 71  Respirations Respirations 20  SBP SBP 194  DBP DBP 74  Pain Scale (0-10)  0  Current Weight (kg) (kg) 48.7  Height (cm) centimeters 152.4  BSA (m2) 1.4   Physical Exam:  General/Skin/HEENT:  General normal   Skin normal   Eyes normal   ENMT normal   Head and Neck normal   Additional PE well-developed thin female in NAD. Right breast is greater than 10 cm mass with involvement generally the entire breast. There is no fixation to the chest wall. She has palpable lymph node measuring partly 3 cm in the right axillary fold. Left breast is free of dominant mass or nodularity into position examined. No axillary or supraclavicular adenopathy is identified. Lungs are clear to A&P cardiac examination shows regular rate and rhythm.   Breasts/Resp/CV/GI/GU:  Respiratory and Thorax normal   Cardiovascular normal   Gastrointestinal normal   Genitourinary normal   MS/Neuro/Psych/Lymph:  Musculoskeletal normal   Neurological normal   Lymphatics normal   Relevent Results:   Relevant Scans and Labs PET scan and MRI brain are pending and will be reviewed when available. Chest x-ray is reviewed.   Assessment and Plan: Impression:   stage IV breast cancer based on cervical node involvement in 81 year old female with progressive locally advanced right breast cancer Plan:   I have discussed the case with Dr. Kallie Edward. Believe a trial of aromatase inhibitor may be indicated since she did not tolerate her chemotherapy well and was lost to followup at [facility]. I will slightly opinion of surgeon regardingmastectomy since I believe control in this large mass with radiation alone would be insufficient can always add radiation therapy postmastectomy if that is indicated. I would reserve radiation  therapy if surgery cannot be performed should she develop pain or ulceration and have set the patient up for followup shortly after her appointment with surgeon. Will review her PET/CT and MRI scan of the brain when it becomes available. Upon was set up to see surgeon will followup.  I would like to take this opportunity to thank you for allowing me to continue to participate in this patient's care.  CC Referral:  cc: Dr. Hervey Ard, Dr. Sherilyn Cooter   Electronic Signatures: Baruch Gouty Roda Shutters (MD)  (Signed 04-Jun-14 14:15)  Authored: HPI, Diagnosis, Past Hx, PFSH, Allergies, Home Meds, ROS, Nursing Notes, Physical Exam, Relevent Results, Encounter Assessment and Plan, CC Referring Physician   Last Updated: 04-Jun-14 14:15 by Armstead Peaks (MD)

## 2014-06-02 NOTE — Op Note (Signed)
PATIENT NAME:  Sonya Bowman, Sonya Bowman MR#:  [id number] DATE OF BIRTH:  1930-09-24  DATE OF PROCEDURE:  07/29/2012  PREOPERATIVE DIAGNOSIS: Advanced right breast cancer.   POSTOPERATIVE DIAGNOSIS: Advanced right breast cancer.   OPERATIVE PROCEDURE: Right modified radical mastectomy.   SURGEON: Hervey Ard, MD.   ANESTHESIA: General by LMA.   ESTIMATED BLOOD LOSS: 75 mL.  FLUID REPLACEMENT: Crystalloid.   CLINICAL NOTE: This 81 year old woman had presented with a large mass and underwent neoadjuvant chemotherapy at [facility]. There was failure to improve. She is now seen at the [facility] and was referred for evaluation of toilet mastectomy.   OPERATIVE NOTE: With the patient under adequate general anesthesia, the breast was prepped with Betadine solution and draped. An elliptical incision was made outside the large mass, which was not fixed to the underlying pectoralis muscle. Flaps were raised to the area just below the clavicle superiorly, sternum medially, rectus fascia inferiorly and the serratus muscle laterally. The breast was then elevated off the pectoralis muscle taking the fascia of that muscle with the specimen. Hemostasis was with electrocautery, 3-0 Vicryl ties and Harmonic scalpel. The axillary envelope was opened and the axillary dissection completed primarily with the Harmonic scalpel. The long thoracic nerve of Bell, thoracodorsal nerve, artery and vein bundle and axillary vein were protected. Both nerves were functional at the end of the procedure. The specimen was sent fresh to pathology for review. The area was irrigated with water and a single 15-gauge Blake drain brought out through an inferomedial stab wound in the flap. This was anchored in place with 3-0 nylon. The skin flaps were approximated with a running 2-0 Vicryl deep dermal suture followed by benzoin and Steri-Strips. Telfa was applied followed by fluff gauze, Kerlix and an Ace  wrap.   The patient tolerated the procedure well and was taken to the recovery room in stable condition.  ____________________________ Sonya Bellow, MD jwb:aw D: 07/29/2012 11:38:37 ET T: 07/29/2012 11:47:11 ET JOB#: [id number]  cc: Sonya Bellow, MD, <Dictator> Mariana Arn, MD at [facility] in [place]. Kallie Edward, MD Carmon Sahli Amedeo Kinsman MD ELECTRONICALLY SIGNED 07/29/2012 18:01

## 2014-09-20 IMAGING — US US OUTSIDE FILMS BREAST
1 series · 4 of 4 positions shown · non-contrast
Comparison: none

[Series 1: us outside films breast · 0.20mm/px · 4 of 4 slices shown]
[im 1/4]
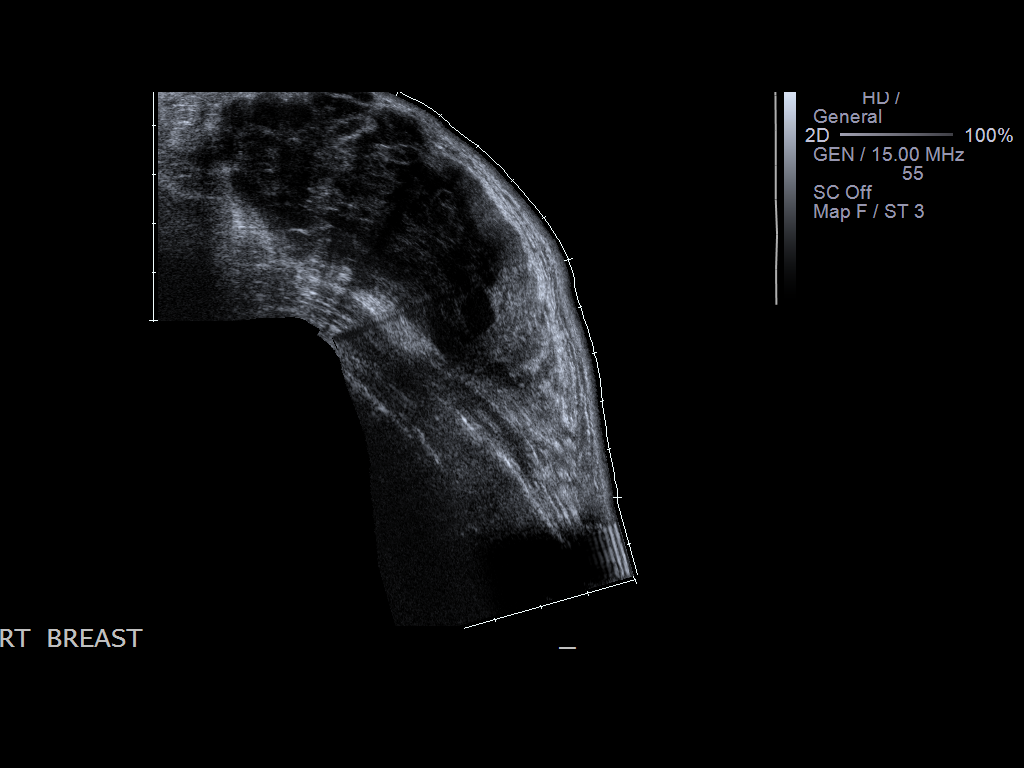
[im 2/4]
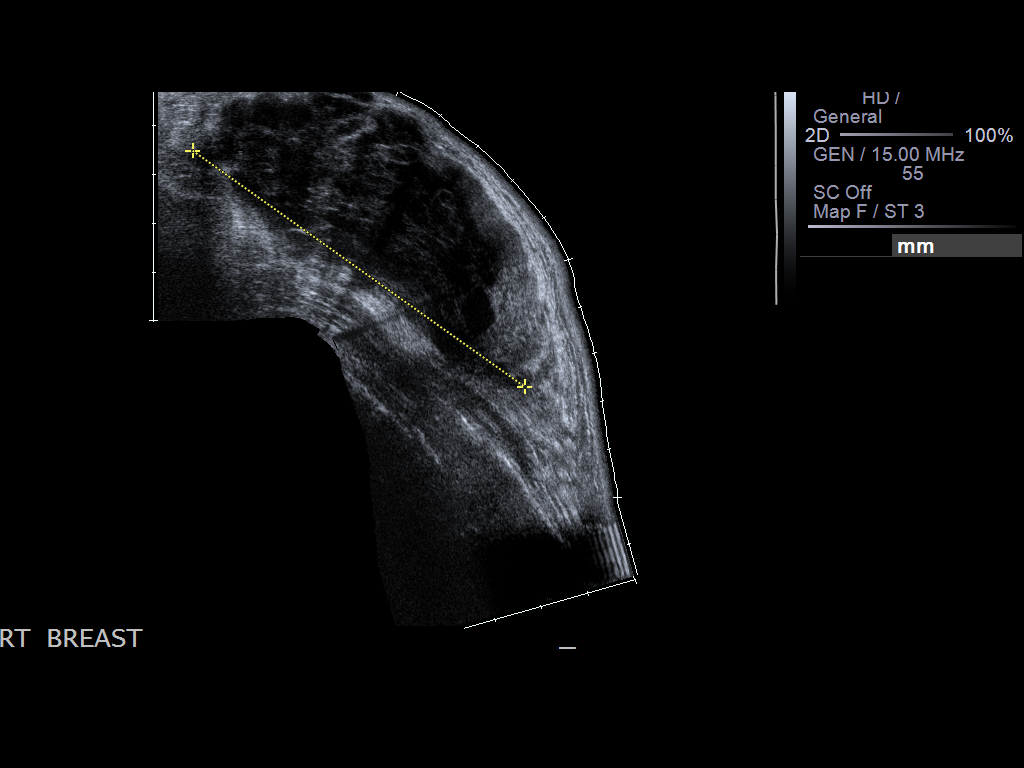
[im 3/4]
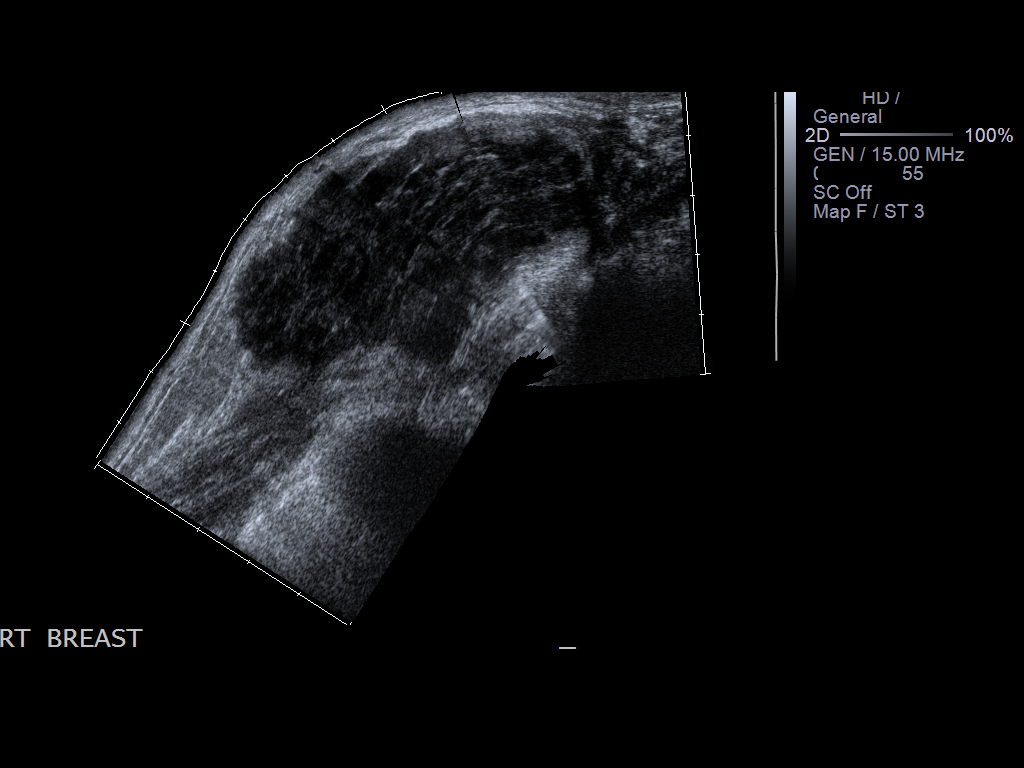
[im 4/4]
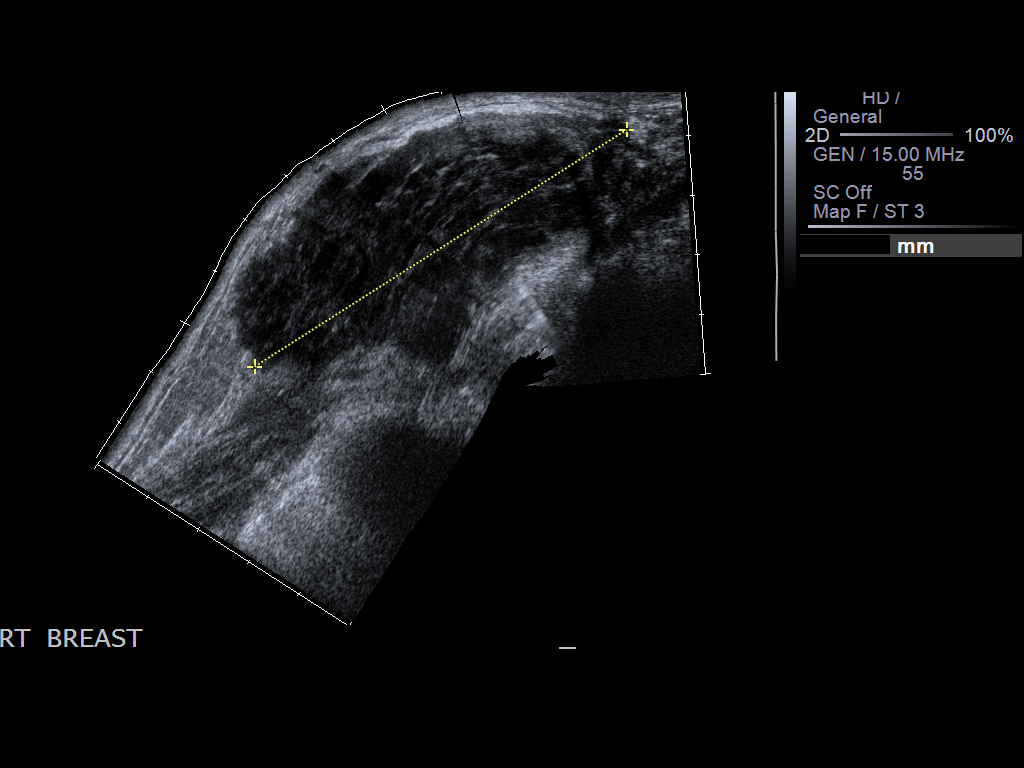

[4 of 4 positions shown; findings below may reference images not displayed]

IMAGES IMPORTED FROM THE SYNGO WORKFLOW SYSTEM
NO DICTATION FOR STUDY

## 2015-10-02 ENCOUNTER — Emergency Department: Payer: Medicare HMO

## 2015-10-02 ENCOUNTER — Emergency Department
Admission: EM | Admit: 2015-10-02 | Discharge: 2015-10-02 | Disposition: A | Discharge status: Home / Self Care | Payer: Medicare HMO | Attending: Student in an Organized Health Care Education/Training Program | Admitting: Student in an Organized Health Care Education/Training Program

## 2015-10-02 ENCOUNTER — Encounter: Payer: Self-pay | Admitting: Emergency Medicine

## 2015-10-02 DIAGNOSIS — M7989 Other specified soft tissue disorders: Secondary | ICD-10-CM

## 2015-10-02 DIAGNOSIS — F17200 Nicotine dependence, unspecified, uncomplicated: Secondary | ICD-10-CM | POA: Diagnosis not present

## 2015-10-02 DIAGNOSIS — Z853 Personal history of malignant neoplasm of breast: Secondary | ICD-10-CM | POA: Diagnosis not present

## 2015-10-02 NOTE — ED Triage Notes (Addendum)
Pt reports right arm swelling x1 month, denies pain at present time. Pt with hx of right sided breast cancer in 2013, had some lymphnodes removed at that time.

## 2015-10-02 NOTE — ED Notes (Signed)
Patient transported to Ultrasound 

## 2015-10-02 NOTE — ED Provider Notes (Signed)
[facility] Emergency Department Provider Note    First MD Initiated Contact with Patient 10/02/15 1654     (approximate)  I have reviewed the triage vital signs and the nursing notes.   HISTORY  Chief Complaint Arm Swelling    HPI Sonya Bowman is a 82 y.o. female with a history of right breast cancer status post resection and lymph node dissection presenting with one month of right upper extremity swelling. Patient states that she never had significant swelling to the right upper extremity previously. She denies any shortness of breath or chest pain. Denies any history of blood clots. Denies any hemoptysis. Denies any numbness or tingling to the right upper extremity. Denies any pain. States that she typically sleeps on her right side and wakes up with the right arm more swollen in the morning than at the end of the day. She denies any orthopnea.   Past Medical History:  Diagnosis Date  . Cancer [facility]) Oct 2013   ypT4b, ypN0, 9.5 cm right breast tumor, 3/18 nodes showing effective neoadjuvant chemotherapy. ER 30%, PR 0%, HER-2/neu negative.  . Gum disease     Patient Active Problem List   Diagnosis Date Noted  . Breast cancer, right ([place]) 05/11/2013  . Breast cancer ([place]) 11/03/2012  . Mass of breast, left 11/03/2012  . Cancer [facility])     Past Surgical History:  Procedure Laterality Date  . BREAST BIOPSY Right 2013  . BREAST SURGERY Right July 29 2012   Modified radical mastectomy  . PORTACATH PLACEMENT Left Dec 2013   [facility]  . power port placement  2014    Prior to Admission medications   Not on File    Allergies Review of patient's allergies indicates no known allergies.  No family history on file.  Social History Social History  Substance Use Topics  . Smoking status: Never Smoker  . Smokeless tobacco: Current User    Types: Snuff  . Alcohol use No    Review of Systems Patient denies headaches, rhinorrhea, blurry vision,  numbness, shortness of breath, chest pain, edema, cough, abdominal pain, nausea, vomiting, diarrhea, dysuria, fevers, rashes or hallucinations unless otherwise stated above in HPI. ____________________________________________   PHYSICAL EXAM:  VITAL SIGNS: Vitals:   10/02/15 1448  BP: (!) 169/61  Pulse: 70  Resp: 18  Temp: 99 F (37.2 C)    Constitutional: Alert and oriented. Well appearing and in no acute distress. Eyes: Conjunctivae are normal. PERRL. EOMI. Head: Atraumatic. Nose: No congestion/rhinnorhea. Mouth/Throat: Mucous membranes are moist.  Oropharynx non-erythematous. Neck: No stridor. Painless ROM. No cervical spine tenderness to palpation Hematological/Lymphatic/Immunilogical: No cervical lymphadenopathy. Cardiovascular: Normal rate, regular rhythm. Grossly normal heart sounds.  Good peripheral circulation. Respiratory: Normal respiratory effort.  No retractions. Lungs CTAB. Gastrointestinal: Soft and nontender. No distention. No abdominal bruits. No CVA tenderness. Genitourinary:  Musculoskeletal: No lower extremity tenderness nor edema.  RUE with 2+ pitting lymphedema. No joint effusions. Full painless ROM.  Brisk cap refill Neurologic:  Normal speech and language. No gross focal neurologic deficits are appreciated. No gait instability. Skin:  Skin is warm, dry and intact. No rash noted.  No warmth or erythema Psychiatric: Mood and affect are normal. Speech and behavior are normal.  ____________________________________________   LABS (all labs ordered are listed, but only abnormal results are displayed)  No results found for this or any previous visit (from the past 24 hour(s)). ____________________________________________   ____________________________________________  RADIOLOGY  RUE duplex without evidence of  DVT CXR my read shows no evidence of acute cardiopulmonary process.  ____________________________________________   PROCEDURES  Procedure(s)  performed: none    Critical Care performed: no ____________________________________________   INITIAL IMPRESSION / ASSESSMENT AND PLAN / ED COURSE  Pertinent labs & imaging results that were available during my care of the patient were reviewed by me and considered in my medical decision making (see chart for details).  DDX: DVT, mass, ischemia, lymphedema  Sonya Bowman is a 82 y.o. who presents to the ED with one month of right upper extremity swelling. Patient afebrile and hemodynamic stable. Upper extremity venous duplex ordered due to concern for DVT shows no evidence of acute thrombus. Based on her history of breast cancer will also order chest x-ray to evaluate for apical mass. Anticipate this is secondary to lymphedema status post right breast resection and will place patient compression stocking. She does not have any signs or symptoms of acute infectious process. Evidence of trauma to the right upper extremity.  Clinical Course  Comment By Time  Chest x-ray reviewed and shows no apical mass or mediastinal mass. Presentation likely secondary to lymphedema. Patient remains dynamically stable in no acute distress. She denies any other discomfort. Patient stable for follow-up with primary care physician.  Have discussed with the patient and available family all diagnostics and treatments performed thus far and all questions were answered to the best of my ability. The patient demonstrates understanding and agreement with plan.  Merlyn Lot, MD 08/22 1815     ____________________________________________   FINAL CLINICAL IMPRESSION(S) / ED DIAGNOSES  Final diagnoses:  Arm swelling  Swelling of extremity, right      NEW MEDICATIONS STARTED DURING THIS VISIT:  New Prescriptions   No medications on file     Note:  This document was prepared using Dragon voice recognition software and may include unintentional dictation errors.    Merlyn Lot, MD 10/02/15  1816

## 2021-01-07 ENCOUNTER — Emergency Department
Admission: EM | Admit: 2021-01-07 | Discharge: 2021-01-07 | Disposition: A | Discharge status: Home / Self Care | Payer: Medicare HMO | Attending: Emergency Medicine | Admitting: Emergency Medicine

## 2021-01-07 ENCOUNTER — Other Ambulatory Visit: Payer: Self-pay

## 2021-01-07 DIAGNOSIS — I10 Essential (primary) hypertension: Secondary | ICD-10-CM | POA: Insufficient documentation

## 2021-01-07 DIAGNOSIS — Z853 Personal history of malignant neoplasm of breast: Secondary | ICD-10-CM | POA: Diagnosis not present

## 2021-01-07 DIAGNOSIS — F17220 Nicotine dependence, chewing tobacco, uncomplicated: Secondary | ICD-10-CM | POA: Insufficient documentation

## 2021-01-07 DIAGNOSIS — R030 Elevated blood-pressure reading, without diagnosis of hypertension: Secondary | ICD-10-CM | POA: Diagnosis present

## 2021-01-07 LAB — BASIC METABOLIC PANEL
Anion gap: 7 (ref 5–15)
BUN: 21 mg/dL (ref 8–23)
CO2: 28 mmol/L (ref 22–32)
Calcium: 9.1 mg/dL (ref 8.9–10.3)
Chloride: 104 mmol/L (ref 98–111)
Creatinine, Ser: 1 mg/dL (ref 0.44–1.00)
GFR, Estimated: 54 mL/min — ABNORMAL LOW (ref >60)
Glucose, Bld: 93 mg/dL (ref 70–99)
Potassium: 3.9 mmol/L (ref 3.5–5.1)
Sodium: 139 mmol/L (ref 135–145)

## 2021-01-07 LAB — CBC
HCT: 41.5 % (ref 36.0–46.0)
Hemoglobin: 13.7 g/dL (ref 12.0–15.0)
MCH: 30.7 pg (ref 26.0–34.0)
MCHC: 33 g/dL (ref 30.0–36.0)
MCV: 93 fL (ref 80.0–100.0)
Platelets: 246 10*3/uL (ref 150–400)
RBC: 4.46 MIL/uL (ref 3.87–5.11)
RDW: 13.6 % (ref 11.5–15.5)
WBC: 5.6 10*3/uL (ref 4.0–10.5)
nRBC: 0 % (ref 0.0–0.2)

## 2021-01-07 LAB — TROPONIN I (HIGH SENSITIVITY): Troponin I (High Sensitivity): 11 ng/L (ref <18)

## 2021-01-07 MED ORDER — LOSARTAN POTASSIUM 25 MG PO TABS: 25.0000 mg | ORAL_TABLET | Freq: Every day | ORAL | 11 refills | Status: DC

## 2021-01-07 NOTE — ED Triage Notes (Signed)
Pt here with hypertension. Pt bp was in the 200s at [facility]. Pt states that she "sometimes" takes her bp medication. Pt in NAD in triage.

## 2021-01-07 NOTE — ED Provider Notes (Signed)
[facility] Emergency Department Provider Note   ____________________________________________    I have reviewed the triage vital signs and the nursing notes.   HISTORY  Chief Complaint Hypertension     HPI Sonya Bowman is a 87 y.o. female who presents with complaints of high blood pressure.  Patient was sent over from [place] clinic.  She is asymptomatic, she has no complaints.  She reports that she does have a history of high blood pressure has never taken blood pressure medication reportedly.  No chest pain, no headache, no neurodeficits  Past Medical History:  Diagnosis Date   Cancer [facility]) Oct 2013   ypT4b, ypN0, 9.5 cm right breast tumor, 3/18 nodes showing effective neoadjuvant chemotherapy. ER 30%, PR 0%, HER-2/neu negative.   Gum disease     Patient Active Problem List   Diagnosis Date Noted   Breast cancer, right ([place]) 05/11/2013   Breast cancer ([place]) 11/03/2012   Mass of breast, left 11/03/2012   Cancer [facility])     Past Surgical History:  Procedure Laterality Date   BREAST BIOPSY Right 2013   BREAST SURGERY Right July 29 2012   Modified radical mastectomy   PORTACATH PLACEMENT Left Dec 2013   [facility]   power port placement  2014    Prior to Admission medications   Medication Sig Start Date End Date Taking? Authorizing Provider  losartan (COZAAR) 25 MG tablet Take 1 tablet (25 mg total) by mouth daily. 01/07/21 01/07/22 Yes Lavonia Drafts, MD     Allergies Patient has no known allergies.  No family history on file.  Social History Social History   Tobacco Use   Smoking status: Never   Smokeless tobacco: Current    Types: Snuff  Substance Use Topics   Alcohol use: No   Drug use: No    Review of Systems  Constitutional: No fever/chills Eyes: No visual changes.  ENT: No sore throat. Cardiovascular: Denies chest pain. Respiratory: Denies shortness of breath. Gastrointestinal: No abdominal pain.  No nausea, no  vomiting.   Genitourinary: Negative for dysuria. Musculoskeletal: Negative for back pain. Skin: Negative for rash. Neurological: Negative for headaches or weakness   ____________________________________________   PHYSICAL EXAM:  VITAL SIGNS: ED Triage Vitals [01/07/21 1414]  Enc Vitals Group     BP (!) 227/83     Pulse Rate (!) 58     Resp 18     Temp 97.6 F (36.4 C)     Temp Source Oral     SpO2 99 %     Weight 49 kg (108 lb)     Height 1.575 m (_0 )     Head Circumference      Peak Flow      Pain Score 0     Pain Loc      Pain Edu?      Excl. in [place]?     Constitutional: Alert and oriented. No acute distress. Pleasant and interactive Eyes: Conjunctivae are normal.  Head: Atraumatic. Nose: No congestion/rhinnorhea.  Cardiovascular: Normal rate, regular rhythm. Grossly normal heart sounds.  Good peripheral circulation. Respiratory: Normal respiratory effort.  No retractions. Lungs CTAB. Gastrointestinal: Soft and nontender. No distention.  No CVA tenderness. Genitourinary: deferred Musculoskeletal: No lower extremity tenderness nor edema.  Warm and well perfused Neurologic:  Normal speech and language. No gross focal neurologic deficits are appreciated.  Skin:  Skin is warm, dry and intact. No rash noted. Psychiatric: Mood and affect are normal. Speech and  behavior are normal.  ____________________________________________   LABS (all labs ordered are listed, but only abnormal results are displayed)  Labs Reviewed  BASIC METABOLIC PANEL - Abnormal; Notable for the following components:      Result Value   GFR, Estimated 54 (*)    All other components within normal limits  CBC  TROPONIN I (HIGH SENSITIVITY)   ____________________________________________  EKG  ED ECG REPORT I, Lavonia Drafts, the attending physician, personally viewed and interpreted this ECG.  Date: 01/07/2021  Rhythm: Sinus bradycardia QRS Axis: normal Intervals: normal ST/T Wave  abnormalities: normal Narrative Interpretation: no evidence of acute ischemia  ____________________________________________  RADIOLOGY  None ____________________________________________   PROCEDURES  Procedure(s) performed: No  Procedures   Critical Care performed: No ____________________________________________   INITIAL IMPRESSION / ASSESSMENT AND PLAN / ED COURSE  Pertinent labs & imaging results that were available during my care of the patient were reviewed by me and considered in my medical decision making (see chart for details).   Patient presents with asymptomatic hypertension, she is not on any medications for this.  Her exam is reassuring, she feels well and has no complaints.  High sensitive troponin, CBC and CMP are unremarkable.  We will start the patient on losartan, recommend outpatient follow-up with PCP for medication adjustment.    ____________________________________________   FINAL CLINICAL IMPRESSION(S) / ED DIAGNOSES  Final diagnoses:  Hypertension, unspecified type        Note:  This document was prepared using Dragon voice recognition software and may include unintentional dictation errors.    Lavonia Drafts, MD 01/07/21 1946

## 2021-01-07 NOTE — ED Provider Notes (Signed)
Emergency Medicine Provider Triage Evaluation Note  Sonya Bowman , a 87 y.o. female  was evaluated in triage.  Pt complains of with a history of cancer, presents to the ED from [facility].  Patient was found to have systolic BPs in the 024O morning.  Patient is on a blood pressure medicine that she is noncompliant with.  Patient has dementia at baseline and denies any complaint at this time.  Review of Systems  Positive: Elevated BP Negative: CP, SOB  Physical Exam  BP (!) 227/83 (BP Location: Left Arm)   Pulse (!) 58   Temp 97.6 F (36.4 C) (Oral)   Resp 18   Ht 5\' 2"  (1.575 m)   Wt 49 kg   SpO2 99%   BMI 19.75 kg/m  Gen:   Awake, no distress  NAD Resp:  Normal effort CTA MSK:   Moves extremities without difficulty  Other:  CVS: sinus brady  Medical Decision Making  Medically screening exam initiated at 2:31 PM.  Appropriate orders placed.  [name] was informed that the remainder of the evaluation will be completed by another provider, this initial triage assessment does not replace that evaluation, and the importance of remaining in the ED until their evaluation is complete.  Geriatric patient with a history of breast cancer and dementia, presents to the ED with elevated systolic blood pressures.   Melvenia Needles, PA-C 01/07/21 1433    Rada Hay, MD 01/07/21 [phone]

## 2021-01-09 ENCOUNTER — Other Ambulatory Visit: Payer: Self-pay | Admitting: Physician Assistant

## 2021-01-09 DIAGNOSIS — R413 Other amnesia: Secondary | ICD-10-CM

## 2021-01-09 DIAGNOSIS — R441 Visual hallucinations: Secondary | ICD-10-CM

## 2021-01-09 DIAGNOSIS — R454 Irritability and anger: Secondary | ICD-10-CM

## 2021-01-10 ENCOUNTER — Other Ambulatory Visit: Payer: Self-pay

## 2021-01-10 ENCOUNTER — Ambulatory Visit
Admission: RE | Admit: 2021-01-10 | Discharge: 2021-01-10 | Disposition: A | Discharge status: Home / Self Care | Payer: Medicare HMO | Source: Ambulatory Visit | Attending: Physician Assistant | Admitting: Physician Assistant

## 2021-01-10 DIAGNOSIS — R441 Visual hallucinations: Secondary | ICD-10-CM | POA: Insufficient documentation

## 2021-01-10 DIAGNOSIS — R413 Other amnesia: Secondary | ICD-10-CM | POA: Diagnosis present

## 2021-01-10 DIAGNOSIS — R454 Irritability and anger: Secondary | ICD-10-CM | POA: Insufficient documentation

## 2021-01-16 ENCOUNTER — Other Ambulatory Visit: Payer: Self-pay | Admitting: Family Medicine

## 2021-01-16 DIAGNOSIS — N6321 Unspecified lump in the left breast, upper outer quadrant: Secondary | ICD-10-CM

## 2021-01-29 ENCOUNTER — Other Ambulatory Visit: Payer: Self-pay | Admitting: Neurology

## 2021-01-29 DIAGNOSIS — I729 Aneurysm of unspecified site: Secondary | ICD-10-CM

## 2021-01-29 DIAGNOSIS — R9089 Other abnormal findings on diagnostic imaging of central nervous system: Secondary | ICD-10-CM

## 2021-03-29 ENCOUNTER — Other Ambulatory Visit: Payer: Self-pay

## 2021-03-29 ENCOUNTER — Ambulatory Visit
Admission: RE | Admit: 2021-03-29 | Discharge: 2021-03-29 | Disposition: A | Discharge status: Home / Self Care | Payer: Medicare HMO | Source: Ambulatory Visit | Attending: Neurology | Admitting: Neurology

## 2021-03-29 DIAGNOSIS — R9089 Other abnormal findings on diagnostic imaging of central nervous system: Secondary | ICD-10-CM | POA: Insufficient documentation

## 2021-03-29 DIAGNOSIS — I671 Cerebral aneurysm, nonruptured: Secondary | ICD-10-CM | POA: Diagnosis not present

## 2021-03-29 DIAGNOSIS — I729 Aneurysm of unspecified site: Secondary | ICD-10-CM

## 2022-04-07 ENCOUNTER — Emergency Department: Payer: Medicare HMO

## 2022-04-07 ENCOUNTER — Inpatient Hospital Stay: Payer: Medicare HMO

## 2022-04-07 ENCOUNTER — Inpatient Hospital Stay
Admission: EM | Admit: 2022-04-07 | Discharge: 2022-04-21 | DRG: 871 | Disposition: A | Discharge status: Skilled Nursing Facility | Payer: Medicare HMO | Source: Skilled Nursing Facility | Attending: Internal Medicine | Admitting: Internal Medicine

## 2022-04-07 ENCOUNTER — Other Ambulatory Visit: Payer: Self-pay

## 2022-04-07 ENCOUNTER — Encounter: Payer: Self-pay | Admitting: Internal Medicine

## 2022-04-07 DIAGNOSIS — I480 Paroxysmal atrial fibrillation: Secondary | ICD-10-CM | POA: Diagnosis present

## 2022-04-07 DIAGNOSIS — E870 Hyperosmolality and hypernatremia: Secondary | ICD-10-CM | POA: Diagnosis present

## 2022-04-07 DIAGNOSIS — I129 Hypertensive chronic kidney disease with stage 1 through stage 4 chronic kidney disease, or unspecified chronic kidney disease: Secondary | ICD-10-CM | POA: Diagnosis present

## 2022-04-07 DIAGNOSIS — F03918 Unspecified dementia, unspecified severity, with other behavioral disturbance: Secondary | ICD-10-CM | POA: Diagnosis present

## 2022-04-07 DIAGNOSIS — M21962 Unspecified acquired deformity of left lower leg: Secondary | ICD-10-CM | POA: Diagnosis present

## 2022-04-07 DIAGNOSIS — E876 Hypokalemia: Secondary | ICD-10-CM | POA: Diagnosis not present

## 2022-04-07 DIAGNOSIS — Z681 Body mass index (BMI) 19 or less, adult: Secondary | ICD-10-CM | POA: Diagnosis not present

## 2022-04-07 DIAGNOSIS — U071 COVID-19: Secondary | ICD-10-CM | POA: Diagnosis present

## 2022-04-07 DIAGNOSIS — N179 Acute kidney failure, unspecified: Secondary | ICD-10-CM | POA: Diagnosis not present

## 2022-04-07 DIAGNOSIS — K5641 Fecal impaction: Secondary | ICD-10-CM | POA: Diagnosis present

## 2022-04-07 DIAGNOSIS — R6520 Severe sepsis without septic shock: Secondary | ICD-10-CM | POA: Diagnosis present

## 2022-04-07 DIAGNOSIS — I214 Non-ST elevation (NSTEMI) myocardial infarction: Secondary | ICD-10-CM | POA: Diagnosis not present

## 2022-04-07 DIAGNOSIS — F03911 Unspecified dementia, unspecified severity, with agitation: Secondary | ICD-10-CM | POA: Diagnosis present

## 2022-04-07 DIAGNOSIS — R680 Hypothermia, not associated with low environmental temperature: Secondary | ICD-10-CM | POA: Diagnosis present

## 2022-04-07 DIAGNOSIS — A419 Sepsis, unspecified organism: Principal | ICD-10-CM | POA: Diagnosis present

## 2022-04-07 DIAGNOSIS — Z781 Physical restraint status: Secondary | ICD-10-CM

## 2022-04-07 DIAGNOSIS — Z823 Family history of stroke: Secondary | ICD-10-CM

## 2022-04-07 DIAGNOSIS — N390 Urinary tract infection, site not specified: Secondary | ICD-10-CM | POA: Diagnosis present

## 2022-04-07 DIAGNOSIS — E440 Moderate protein-calorie malnutrition: Secondary | ICD-10-CM | POA: Diagnosis not present

## 2022-04-07 DIAGNOSIS — R54 Age-related physical debility: Secondary | ICD-10-CM | POA: Diagnosis present

## 2022-04-07 DIAGNOSIS — Z7189 Other specified counseling: Secondary | ICD-10-CM | POA: Diagnosis not present

## 2022-04-07 DIAGNOSIS — T68XXXA Hypothermia, initial encounter: Secondary | ICD-10-CM

## 2022-04-07 DIAGNOSIS — R001 Bradycardia, unspecified: Secondary | ICD-10-CM | POA: Diagnosis present

## 2022-04-07 DIAGNOSIS — E860 Dehydration: Secondary | ICD-10-CM | POA: Diagnosis present

## 2022-04-07 DIAGNOSIS — G9341 Metabolic encephalopathy: Secondary | ICD-10-CM | POA: Diagnosis not present

## 2022-04-07 DIAGNOSIS — Z66 Do not resuscitate: Secondary | ICD-10-CM | POA: Diagnosis present

## 2022-04-07 DIAGNOSIS — F0390 Unspecified dementia without behavioral disturbance: Secondary | ICD-10-CM | POA: Diagnosis present

## 2022-04-07 DIAGNOSIS — I4891 Unspecified atrial fibrillation: Principal | ICD-10-CM | POA: Diagnosis present

## 2022-04-07 DIAGNOSIS — R7989 Other specified abnormal findings of blood chemistry: Secondary | ICD-10-CM | POA: Diagnosis not present

## 2022-04-07 DIAGNOSIS — R627 Adult failure to thrive: Secondary | ICD-10-CM | POA: Diagnosis present

## 2022-04-07 DIAGNOSIS — Z720 Tobacco use: Secondary | ICD-10-CM

## 2022-04-07 DIAGNOSIS — Z9011 Acquired absence of right breast and nipple: Secondary | ICD-10-CM

## 2022-04-07 DIAGNOSIS — N170 Acute kidney failure with tubular necrosis: Secondary | ICD-10-CM | POA: Diagnosis not present

## 2022-04-07 DIAGNOSIS — N182 Chronic kidney disease, stage 2 (mild): Secondary | ICD-10-CM | POA: Diagnosis not present

## 2022-04-07 DIAGNOSIS — I639 Cerebral infarction, unspecified: Secondary | ICD-10-CM

## 2022-04-07 DIAGNOSIS — E861 Hypovolemia: Secondary | ICD-10-CM | POA: Diagnosis present

## 2022-04-07 DIAGNOSIS — I10 Essential (primary) hypertension: Secondary | ICD-10-CM | POA: Diagnosis present

## 2022-04-07 DIAGNOSIS — Z515 Encounter for palliative care: Secondary | ICD-10-CM

## 2022-04-07 DIAGNOSIS — Z853 Personal history of malignant neoplasm of breast: Secondary | ICD-10-CM

## 2022-04-07 DIAGNOSIS — Z7901 Long term (current) use of anticoagulants: Secondary | ICD-10-CM

## 2022-04-07 DIAGNOSIS — Z8673 Personal history of transient ischemic attack (TIA), and cerebral infarction without residual deficits: Secondary | ICD-10-CM

## 2022-04-07 DIAGNOSIS — Z833 Family history of diabetes mellitus: Secondary | ICD-10-CM

## 2022-04-07 DIAGNOSIS — Z79899 Other long term (current) drug therapy: Secondary | ICD-10-CM

## 2022-04-07 DIAGNOSIS — K5289 Other specified noninfective gastroenteritis and colitis: Secondary | ICD-10-CM | POA: Diagnosis present

## 2022-04-07 DIAGNOSIS — Z8249 Family history of ischemic heart disease and other diseases of the circulatory system: Secondary | ICD-10-CM

## 2022-04-07 DIAGNOSIS — R531 Weakness: Secondary | ICD-10-CM | POA: Diagnosis present

## 2022-04-07 DIAGNOSIS — E43 Unspecified severe protein-calorie malnutrition: Secondary | ICD-10-CM | POA: Insufficient documentation

## 2022-04-07 HISTORY — DX: Unspecified dementia, unspecified severity, without behavioral disturbance, psychotic disturbance, mood disturbance, and anxiety: F03.90

## 2022-04-07 HISTORY — DX: Essential (primary) hypertension: I10

## 2022-04-07 HISTORY — DX: Chronic kidney disease, stage 2 (mild): N18.2

## 2022-04-07 HISTORY — DX: Cerebral infarction, unspecified: I63.9

## 2022-04-07 LAB — PROTIME-INR
INR: 1 (ref 0.8–1.2)
Prothrombin Time: 13.4 seconds (ref 11.4–15.2)

## 2022-04-07 LAB — CBC WITH DIFFERENTIAL/PLATELET
Abs Immature Granulocytes: 0.02 10*3/uL (ref 0.00–0.07)
Basophils Absolute: 0 10*3/uL (ref 0.0–0.1)
Basophils Relative: 1 %
Eosinophils Absolute: 0 10*3/uL (ref 0.0–0.5)
Eosinophils Relative: 0 %
HCT: 43.2 % (ref 36.0–46.0)
Hemoglobin: 13.8 g/dL (ref 12.0–15.0)
Immature Granulocytes: 0 %
Lymphocytes Relative: 14 %
Lymphs Abs: 1.2 10*3/uL (ref 0.7–4.0)
MCH: 30.1 pg (ref 26.0–34.0)
MCHC: 31.9 g/dL (ref 30.0–36.0)
MCV: 94.3 fL (ref 80.0–100.0)
Monocytes Absolute: 0.9 10*3/uL (ref 0.1–1.0)
Monocytes Relative: 10 %
Neutro Abs: 6.3 10*3/uL (ref 1.7–7.7)
Neutrophils Relative %: 75 %
Platelets: 378 10*3/uL (ref 150–400)
RBC: 4.58 MIL/uL (ref 3.87–5.11)
RDW: 13.9 % (ref 11.5–15.5)
WBC: 8.4 10*3/uL (ref 4.0–10.5)
nRBC: 0 % (ref 0.0–0.2)

## 2022-04-07 LAB — COMPREHENSIVE METABOLIC PANEL
ALT: 18 U/L (ref 0–44)
AST: 79 U/L — ABNORMAL HIGH (ref 15–41)
Albumin: 3 g/dL — ABNORMAL LOW (ref 3.5–5.0)
Alkaline Phosphatase: 87 U/L (ref 38–126)
Anion gap: 11 (ref 5–15)
BUN: 55 mg/dL — ABNORMAL HIGH (ref 8–23)
CO2: 28 mmol/L (ref 22–32)
Calcium: 8.8 mg/dL — ABNORMAL LOW (ref 8.9–10.3)
Chloride: 107 mmol/L (ref 98–111)
Creatinine, Ser: 1.67 mg/dL — ABNORMAL HIGH (ref 0.44–1.00)
GFR, Estimated: 29 mL/min — ABNORMAL LOW (ref >60)
Glucose, Bld: 131 mg/dL — ABNORMAL HIGH (ref 70–99)
Potassium: 4.1 mmol/L (ref 3.5–5.1)
Sodium: 146 mmol/L — ABNORMAL HIGH (ref 135–145)
Total Bilirubin: 0.7 mg/dL (ref 0.3–1.2)
Total Protein: 8 g/dL (ref 6.5–8.1)

## 2022-04-07 LAB — BRAIN NATRIURETIC PEPTIDE: B Natriuretic Peptide: 908.4 pg/mL — ABNORMAL HIGH (ref 0.0–100.0)

## 2022-04-07 LAB — URINALYSIS, W/ REFLEX TO CULTURE (INFECTION SUSPECTED)
Bacteria, UA: NONE SEEN
Bilirubin Urine: NEGATIVE
Glucose, UA: NEGATIVE mg/dL
Hgb urine dipstick: NEGATIVE
Ketones, ur: 5 mg/dL — AB
Nitrite: NEGATIVE
Protein, ur: 100 mg/dL — AB
Specific Gravity, Urine: 1.028 (ref 1.005–1.030)
pH: 5 (ref 5.0–8.0)

## 2022-04-07 LAB — TROPONIN I (HIGH SENSITIVITY)
Troponin I (High Sensitivity): 6282 ng/L (ref <18)
Troponin I (High Sensitivity): 4924 ng/L (ref <18)
Troponin I (High Sensitivity): 6701 ng/L (ref <18)
Troponin I (High Sensitivity): 7040 ng/L (ref <18)

## 2022-04-07 LAB — PROCALCITONIN: Procalcitonin: 2.63 ng/mL

## 2022-04-07 LAB — RESP PANEL BY RT-PCR (RSV, FLU A&B, COVID)  RVPGX2
Influenza A by PCR: NEGATIVE
Influenza B by PCR: NEGATIVE
Resp Syncytial Virus by PCR: NEGATIVE
SARS Coronavirus 2 by RT PCR: POSITIVE — AB

## 2022-04-07 LAB — LACTIC ACID, PLASMA
Lactic Acid, Venous: 3.8 mmol/L (ref 0.5–1.9)
Lactic Acid, Venous: 6.1 mmol/L (ref 0.5–1.9)

## 2022-04-07 LAB — D-DIMER, QUANTITATIVE: D-Dimer, Quant: 1.36 ug/mL-FEU — ABNORMAL HIGH (ref 0.00–0.50)

## 2022-04-07 LAB — APTT: aPTT: 30 seconds (ref 24–36)

## 2022-04-07 LAB — HEPARIN LEVEL (UNFRACTIONATED): Heparin Unfractionated: 0.28 IU/mL — ABNORMAL LOW (ref 0.30–0.70)

## 2022-04-07 LAB — TSH: TSH: 2.703 u[IU]/mL (ref 0.350–4.500)

## 2022-04-07 MED ORDER — QUETIAPINE FUMARATE 25 MG PO TABS
12.5000 mg | ORAL_TABLET | Freq: Every day | ORAL | Status: DC
  Administered 2022-04-09 – 2022-04-21 (×12): 12.5 mg via ORAL
  Filled 2022-04-07 (×11): qty 1

## 2022-04-07 MED ORDER — ONDANSETRON HCL 4 MG/2ML IJ SOLN: 4.0000 mg | Freq: Three times a day (TID) | INTRAMUSCULAR | Status: DC | PRN

## 2022-04-07 MED ORDER — HEPARIN BOLUS VIA INFUSION
3000.0000 [IU] | Freq: Once | INTRAVENOUS | Status: AC
  Administered 2022-04-07: 3000 [IU] via INTRAVENOUS
  Filled 2022-04-07: qty 3000

## 2022-04-07 MED ORDER — ASPIRIN 81 MG PO CHEW
324.0000 mg | CHEWABLE_TABLET | Freq: Once | ORAL | Status: AC
  Administered 2022-04-07: 324 mg via ORAL
  Filled 2022-04-07: qty 4

## 2022-04-07 MED ORDER — LACTATED RINGERS IV SOLN: INTRAVENOUS | Status: DC

## 2022-04-07 MED ORDER — LACTATED RINGERS IV BOLUS (SEPSIS)
500.0000 mL | Freq: Once | INTRAVENOUS | Status: AC
  Administered 2022-04-07: 500 mL via INTRAVENOUS

## 2022-04-07 MED ORDER — ENSURE ENLIVE PO LIQD: 237.0000 mL | Freq: Two times a day (BID) | ORAL | Status: DC

## 2022-04-07 MED ORDER — LACTATED RINGERS IV BOLUS: 500.0000 mL | Freq: Once | INTRAVENOUS | Status: DC

## 2022-04-07 MED ORDER — LACTATED RINGERS IV BOLUS
1000.0000 mL | Freq: Once | INTRAVENOUS | Status: AC
  Administered 2022-04-07: 1000 mL via INTRAVENOUS

## 2022-04-07 MED ORDER — QUETIAPINE FUMARATE 25 MG PO TABS
25.0000 mg | ORAL_TABLET | Freq: Every day | ORAL | Status: DC
  Administered 2022-04-09 – 2022-04-20 (×10): 25 mg via ORAL
  Filled 2022-04-07 (×10): qty 1

## 2022-04-07 MED ORDER — ALBUTEROL SULFATE (2.5 MG/3ML) 0.083% IN NEBU: 2.5000 mg | INHALATION_SOLUTION | RESPIRATORY_TRACT | Status: DC | PRN

## 2022-04-07 MED ORDER — HEPARIN BOLUS VIA INFUSION
800.0000 [IU] | Freq: Once | INTRAVENOUS | Status: AC
  Administered 2022-04-07: 800 [IU] via INTRAVENOUS
  Filled 2022-04-07: qty 800

## 2022-04-07 MED ORDER — ACETAMINOPHEN 325 MG PO TABS
650.0000 mg | ORAL_TABLET | Freq: Four times a day (QID) | ORAL | Status: DC | PRN
  Filled 2022-04-07: qty 2

## 2022-04-07 MED ORDER — PSYLLIUM 95 % PO PACK
1.0000 | PACK | Freq: Two times a day (BID) | ORAL | Status: DC
  Administered 2022-04-09: 1 via ORAL
  Filled 2022-04-07 (×6): qty 1

## 2022-04-07 MED ORDER — METOPROLOL SUCCINATE ER 25 MG PO TB24
12.5000 mg | ORAL_TABLET | Freq: Every day | ORAL | Status: DC
  Administered 2022-04-09 – 2022-04-10 (×2): 12.5 mg via ORAL
  Filled 2022-04-07 (×3): qty 1

## 2022-04-07 MED ORDER — HEPARIN (PORCINE) 25000 UT/250ML-% IV SOLN
850.0000 [IU]/h | INTRAVENOUS | Status: DC
  Administered 2022-04-07: 600 [IU]/h via INTRAVENOUS
  Filled 2022-04-07: qty 250

## 2022-04-07 MED ORDER — DM-GUAIFENESIN ER 30-600 MG PO TB12: 1.0000 | ORAL_TABLET | Freq: Two times a day (BID) | ORAL | Status: DC | PRN

## 2022-04-07 MED ORDER — LOPERAMIDE HCL 2 MG PO CAPS
2.0000 mg | ORAL_CAPSULE | Freq: Two times a day (BID) | ORAL | Status: DC | PRN
  Filled 2022-04-07: qty 1

## 2022-04-07 MED ORDER — HYDRALAZINE HCL 10 MG PO TABS
10.0000 mg | ORAL_TABLET | Freq: Three times a day (TID) | ORAL | Status: DC
  Administered 2022-04-09 – 2022-04-10 (×3): 10 mg via ORAL
  Filled 2022-04-07 (×5): qty 1

## 2022-04-07 MED ORDER — ATORVASTATIN CALCIUM 20 MG PO TABS
40.0000 mg | ORAL_TABLET | Freq: Every day | ORAL | Status: DC
  Administered 2022-04-09: 40 mg via ORAL
  Filled 2022-04-07 (×3): qty 2

## 2022-04-07 MED ORDER — ASPIRIN 81 MG PO TBEC
81.0000 mg | DELAYED_RELEASE_TABLET | Freq: Every day | ORAL | Status: DC
  Administered 2022-04-09: 81 mg via ORAL
  Filled 2022-04-07 (×3): qty 1

## 2022-04-07 MED ORDER — SODIUM CHLORIDE 0.9 % IV SOLN
2.0000 g | INTRAVENOUS | Status: DC
  Administered 2022-04-08 – 2022-04-09 (×2): 2 g via INTRAVENOUS
  Filled 2022-04-07 (×2): qty 20

## 2022-04-07 MED ORDER — SODIUM CHLORIDE 0.9 % IV SOLN
2.0000 g | Freq: Once | INTRAVENOUS | Status: AC
  Administered 2022-04-07: 2 g via INTRAVENOUS
  Filled 2022-04-07: qty 20

## 2022-04-07 MED ORDER — ACETAMINOPHEN 650 MG RE SUPP: 650.0000 mg | Freq: Four times a day (QID) | RECTAL | Status: DC | PRN

## 2022-04-07 MED ORDER — TECHNETIUM TO 99M ALBUMIN AGGREGATED
4.0000 | Freq: Once | INTRAVENOUS | Status: AC | PRN
  Administered 2022-04-07: 4.37 via INTRAVENOUS

## 2022-04-07 MED ORDER — HALOPERIDOL LACTATE 5 MG/ML IJ SOLN
2.0000 mg | Freq: Four times a day (QID) | INTRAMUSCULAR | Status: AC | PRN
  Administered 2022-04-07 – 2022-04-08 (×2): 2 mg via INTRAVENOUS
  Filled 2022-04-07 (×3): qty 1

## 2022-04-07 MED ORDER — MIDAZOLAM HCL 2 MG/2ML IJ SOLN
2.0000 mg | Freq: Once | INTRAMUSCULAR | Status: AC
  Administered 2022-04-07: 2 mg via INTRAVENOUS
  Filled 2022-04-07: qty 2

## 2022-04-07 MED ORDER — DIVALPROEX SODIUM 125 MG PO CSDR
125.0000 mg | DELAYED_RELEASE_CAPSULE | Freq: Two times a day (BID) | ORAL | Status: DC
  Administered 2022-04-09 – 2022-04-21 (×22): 125 mg via ORAL
  Filled 2022-04-07 (×28): qty 1

## 2022-04-07 MED ORDER — HYDRALAZINE HCL 20 MG/ML IJ SOLN
5.0000 mg | INTRAMUSCULAR | Status: DC | PRN
  Administered 2022-04-08 (×2): 5 mg via INTRAVENOUS
  Filled 2022-04-07 (×2): qty 1

## 2022-04-07 MED ORDER — HALOPERIDOL LACTATE 5 MG/ML IJ SOLN: 2.0000 mg | Freq: Four times a day (QID) | INTRAMUSCULAR | Status: DC | PRN

## 2022-04-07 MED ORDER — SENNOSIDES-DOCUSATE SODIUM 8.6-50 MG PO TABS: 1.0000 | ORAL_TABLET | Freq: Every evening | ORAL | Status: DC | PRN

## 2022-04-07 NOTE — ED Notes (Signed)
Pt transported to Radiology with Tech.

## 2022-04-07 NOTE — Progress Notes (Signed)
[facility] for IV heparin Indication: chest pain/ACS  No Known Allergies  Patient Measurements: Height: '5\' 2"'$  (157.5 cm) Weight: 49 kg (108 lb 0.4 oz) IBW/kg (Calculated) : 50.1 Heparin Dosing Weight: 49 kg  Vital Signs: Temp: 97.7 F (36.5 C) (02/26 1505) Temp Source: Axillary (02/26 1505) BP: 176/81 (02/26 2030) Pulse Rate: 77 (02/26 2030)  Labs: Recent Labs    04/07/22 0840 04/07/22 0946 04/07/22 1144 04/07/22 1945  HGB 13.8  --   --   --   HCT 43.2  --   --   --   PLT 378  --   --   --   APTT 30  --   --   --   LABPROT 13.4  --   --   --   INR 1.0  --   --   --   HEPARINUNFRC  --   --   --  0.28*  CREATININE  --  1.67*  --   --   TROPONINIHS 6,282*  --  4,924* 6,701*     Estimated Creatinine Clearance: 17.3 mL/min (A) (by C-G formula based on SCr of 1.67 mg/dL (H)).   Medications:  No anticoagulation prior to admission  Assessment: 89 year old female presenting with altered mental status and tachycardia being admitted with ACS. PMH includes dementia and history of breast cancer.   Troponin > 6,000. Baseline H&H stable. Baseline aPTT 30  Date Time HL Rate/Comment 2/26 2000 0.28 Subtherapeutic / 600 > 700 u/hr  Goal of Therapy:  Heparin level 0.3-0.7 units/ml Monitor platelets by anticoagulation protocol: Yes    Plan:  Give 800 units bolus x1; then start heparin infusion to 700 units/hr Check anti-Xa level in 8 hours and daily once consecutively therapeutic. Continue to monitor H&H and platelets daily while on heparin gtt.  [place] Pharmacist 04/07/2022 9:02 PM

## 2022-04-07 NOTE — ED Notes (Signed)
Repeat CMP sent to lab

## 2022-04-07 NOTE — Progress Notes (Signed)
[place] for IV heparin Indication: chest pain/ACS  No Known Allergies  Patient Measurements: Height: '5\' 2"'$  (157.5 cm) Weight: 49 kg (108 lb 0.4 oz) IBW/kg (Calculated) : 50.1 Heparin Dosing Weight: 49 kg  Vital Signs: Temp: 95.6 F (35.3 C) (02/26 0845) Temp Source: Rectal (02/26 0845) BP: 164/75 (02/26 1000) Pulse Rate: 68 (02/26 1000)  Labs: Recent Labs    04/07/22 0840 04/07/22 0946  HGB 13.8  --   HCT 43.2  --   PLT 378  --   APTT 30  --   LABPROT 13.4  --   INR 1.0  --   CREATININE  --  1.67*  TROPONINIHS 6,282*  --     Estimated Creatinine Clearance: 17.3 mL/min (A) (by C-G formula based on SCr of 1.67 mg/dL (H)).   Medications:  No anticoagulation prior to admission  Assessment: 89 year old female presenting with altered mental status and tachycardia being admitted with ACS. PMH includes dementia and history of breast cancer.   Troponin > 6,000. Baseline H&H stable. Baseline aPTT 30  Goal of Therapy:  Heparin level 0.3-0.7 units/ml Monitor platelets by anticoagulation protocol: Yes    Plan:  Give 3,000 units bolus x 1 Start heparin infusion at 600 units/hr Check anti-Xa level in 8 hours and daily while on heparin Continue to monitor H&H and platelets  Wynelle Cleveland 04/07/2022,11:59 AM

## 2022-04-07 NOTE — ED Notes (Signed)
Patient refuses to let nurse take temp under tongue and armpit

## 2022-04-07 NOTE — ED Triage Notes (Signed)
Pt arrives via EMS from [facility] memory care with weakness and AMS. Pt Afib RVR with no hx. Pt 10 days post covid. Pt has lots of mucus in nose and mouth.
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
## 2022-04-07 NOTE — ED Provider Notes (Addendum)
[facility] Provider Note    Event Date/Time   First MD Initiated Contact with Patient 04/07/22 [phone]     (approximate)   History   Weakness   HPI  Sonya Bowman is a 89 y.o. female past medical history significant for dementia, prior history of breast cancer, who presents to the emergency department with altered mental status and tachycardia.  History is provided by EMS.  Patient presents from [place] home memory care unit.  States that she has had altered mental status and was found to be tachycardic and in A-fib with RVR today.  Would then convert back into normal sinus rhythm and become bradycardic in the 50s according to EMS.  Not on anticoagulation.  No no falls or trauma.  Recently had COVID approximately 10 days ago.  Patient without complaints and states that she just feels cold.  Altered mental status from her baseline of confusion.  Patient's daughter arrived to the emergency department.  States that this is at her mental status baseline.  States that she was told that she tested positive for COVID recently.     Physical Exam   Triage Vital Signs: ED Triage Vitals [04/07/22 0834]  Enc Vitals Group     BP (!) 147/77     Pulse Rate (!) 147     Resp 19     Temp      Temp src      SpO2      Weight      Height      Head Circumference      Peak Flow      Pain Score      Pain Loc      Pain Edu?      Excl. in [place]?     Most recent vital signs: Vitals:   04/07/22 0930 04/07/22 1000  BP: 113/66 (!) 164/75  Pulse: 60 68  Resp: (!) 22 18  Temp:    SpO2:  92%    Physical Exam Constitutional:      Appearance: She is well-developed.  HENT:     Head:     Comments: Dry mucous membranes Eyes:     Conjunctiva/sclera: Conjunctivae normal.  Cardiovascular:     Rate and Rhythm: Regular rhythm.  Pulmonary:     Effort: No respiratory distress.     Breath sounds: Rhonchi present.  Abdominal:     General: There is no distension.      Tenderness: There is no abdominal tenderness.  Musculoskeletal:        General: Normal range of motion.     Cervical back: Normal range of motion.     Comments: Moving all extremities, no unilateral leg swelling.  Skin:    General: Skin is warm.  Neurological:     Mental Status: She is alert. She is disoriented.     Comments: Following simple commands, denies any complaints of pain.  Patient is not oriented.     IMPRESSION / MDM / ASSESSMENT AND PLAN / ED COURSE  I reviewed the triage vital signs and the nursing notes.  Differential diagnosis including pneumonia, atrial fibrillation with a rapid rate, dehydration, electrolyte abnormality, pulmonary embolism, ACS, infectious process  EKG  I, Nathaniel Man, the attending physician, personally viewed and interpreted this ECG.   Rate: Normal  Rhythm: Normal sinus  Axis: Normal  Intervals: Normal  ST&T Change: None  Atrial fibrillation with a rapid rate in the 150s-180s while on cardiac telemetry.  The converted back into normal sinus rhythm on cardiac telemetry.  Patient then converted back into normal sinus rhythm.  Repeat EKG with signs of LVH.  Nonspecific ST changes.  ST depression to the lateral leads.  P waves present.  RADIOLOGY I independently reviewed imaging, my interpretation of imaging: Chest x-ray, CT scan of the head  Chest x-ray with no focal findings consistent with pneumonia.  CT scan of the head with no signs of intracranial hemorrhage or infarction.  LABS (all labs ordered are listed, but only abnormal results are displayed) Labs interpreted as -    Labs Reviewed  RESP PANEL BY RT-PCR (RSV, FLU A&B, COVID)  RVPGX2 - Abnormal; Notable for the following components:      Result Value   SARS Coronavirus 2 by RT PCR POSITIVE (*)    All other components within normal limits  LACTIC ACID, PLASMA - Abnormal; Notable for the following components:   Lactic Acid, Venous 3.8 (*)    All other components within  normal limits  BRAIN NATRIURETIC PEPTIDE - Abnormal; Notable for the following components:   B Natriuretic Peptide 908.4 (*)    All other components within normal limits  D-DIMER, QUANTITATIVE - Abnormal; Notable for the following components:   D-Dimer, Quant 1.36 (*)    All other components within normal limits  COMPREHENSIVE METABOLIC PANEL - Abnormal; Notable for the following components:   Sodium 146 (*)    Glucose, Bld 131 (*)    BUN 55 (*)    Creatinine, Ser 1.67 (*)    Calcium 8.8 (*)    Albumin 3.0 (*)    AST 79 (*)    GFR, Estimated 29 (*)    All other components within normal limits  TROPONIN I (HIGH SENSITIVITY) - Abnormal; Notable for the following components:   Troponin I (High Sensitivity) 6,282 (*)    All other components within normal limits  CULTURE, BLOOD (ROUTINE X 2)  CULTURE, BLOOD (ROUTINE X 2)  CBC WITH DIFFERENTIAL/PLATELET  PROTIME-INR  APTT  LACTIC ACID, PLASMA  URINALYSIS, W/ REFLEX TO CULTURE (INFECTION SUSPECTED)  TSH  TROPONIN I (HIGH SENSITIVITY)    TREATMENT  1 L of IV fluids  MDM    Altered mental status and tachycardia on arrival with new onset atrial fibrillation with a rapid rate.  On chart review do not see any history of atrial fibrillation and is not on anticoagulation.  Given her tachycardia and altered mental status concern for possible infectious process/sepsis.  Blood cultures obtained and obtained a lactic acid.  Felt that 30 cc/kg of IV fluids may be detrimental given possible new onset heart failure with shortness of breath, given 500 bolus and will reevaluate.  Lactic acid elevated.  Unclear source, concern for possible urinary tract infection.  Significantly dehydrated in the emergency department, will start on IV Rocephin to cover for urinary tract infection while waiting for the urine.  Patient combative, difficult IV, delay in antibiotics  Troponin significantly elevated.  Likely secondary to demand ischemia from atrial  fibrillation with a rapid rate.  Patient without chest pain at this time but does have altered mental status and is a poor historian.  EKG without ST elevation.  Given aspirin.   On reevaluation patient converted back to normal sinus rhythm.  Repeat EKG obtained with nonspecific ST changes.  Elevated D-dimer however patient with acute kidney injury with a creatinine elevated and a poor GFR, can obtain VQ scan.   Consulted hospitalist for admission for new  onset atrial fibrillation with a rapid rate.  PROCEDURES:  Critical Care performed: Yes  .Critical Care  Performed by: Nathaniel Man, MD Authorized by: Nathaniel Man, MD   Critical care provider statement:    Critical care time (minutes):  30   Critical care time was exclusive of:  Separately billable procedures and treating other patients   Critical care was necessary to treat or prevent imminent or life-threatening deterioration of the following conditions:  Cardiac failure   Critical care was time spent personally by me on the following activities:  Development of treatment plan with patient or surrogate, discussions with consultants, evaluation of patient's response to treatment, examination of patient, ordering and review of laboratory studies, ordering and review of radiographic studies, ordering and performing treatments and interventions, pulse oximetry, re-evaluation of patient's condition and review of old charts   Patient's presentation is most consistent with acute presentation with potential threat to life or bodily function.   MEDICATIONS ORDERED IN ED: Medications  cefTRIAXone (ROCEPHIN) 2 g in sodium chloride 0.9 % 100 mL IVPB (has no administration in time range)  lactated ringers bolus 500 mL (0 mLs Intravenous Stopped 04/07/22 0912)  aspirin chewable tablet 324 mg (324 mg Oral Given 04/07/22 0950)  lactated ringers bolus 1,000 mL (1,000 mLs Intravenous New Bag/Given 04/07/22 0950)    FINAL CLINICAL IMPRESSION(S) /  ED DIAGNOSES   Final diagnoses:  Atrial fibrillation, rapid ([place])     Rx / DC Orders   ED Discharge Orders     None        Note:  This document was prepared using Dragon voice recognition software and may include unintentional dictation errors.   Nathaniel Man, MD 04/07/22 1118    Nathaniel Man, MD 04/07/22 1149
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
## 2022-04-07 NOTE — ED Notes (Signed)
Report received from [place], [place]
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
## 2022-04-07 NOTE — H&P (Addendum)
History and Physical    Sonya Bowman [id number] DOB: 1930/12/12 DOA: 04/07/2022  Referring MD/NP/PA:   PCP: Pcp, No   Patient coming from:  The patient is coming from SNF    Chief Complaint: weakness, AMS  HPI: Sonya Bowman is a 89 y.o. female with medical history significant of stroke, HTN, dementia, CKD-2, breast cancer (s/p of right mastectomy, chemo and radiation therapy), positive Covid 19 test at 7 days ago, who presents with weakness and altered mental status.   Per her daughter at bedside, pt was recently tested positive for COVID about 1 week ago.  Patient has cough with little mucus production.  Does not have respiratory distress.  At her normal baseline, patient is not oriented to the place and time, but can recognize family members.  Today patient is more confused, is not orientated x 3.  Cannot recognize her daughter anymore.  Not sure if patient has any chest pain. No active nausea, vomiting, diarrhea noted.  Patient is confused and very agitated initially, and she received 1 dose of Versed, and then calm down and in sleeping when I saw pt in ED.  She moves all extremities.  No facial droop or slurred speech.  Her daughter states pt has questionable deformity along the left lateral shin with prominence of the fibular head. Her daughter is also concerned about some abdominal tenderness thought pt can not tell. Pt seems to have some abdominal pain on palpation.  Pt is found to have new onset atrial fibrillation with RVR, heart rate up to 147, converted to sinus rhythm in ED, with heart rate 50-60s now.  Data reviewed independently and ED Course: pt was found to have positive COVID, WBC 8.4, lactic acid 3.8, troponin 6282, positive D-dimer 1.36, sodium 146, worsening renal function with creatinine 1.67, BUN 55, GFR 29 (baseline creatinine 1.0 on 01/15/2021), hypothermia with temperature 95.6, blood pressure 164/75, RR 23, oxygen saturation 92-95% on room air.  Chest x-ray  negative.  Patient is admitted to PCU as inpatient.  Dr. Fletcher Anon of of cardiology is consulted.   EKG: I have personally reviewed.  A-fib, QTc 486, poor R wave progression, heart rate of 165   Review of Systems: Could not be reviewed due to dementia and altered mental status.   Allergy: No Known Allergies  Past Medical History:  Diagnosis Date   Cancer ([place]) 11/2011   ypT4b, ypN0, 9.5 cm right breast tumor, 3/18 nodes showing effective neoadjuvant chemotherapy. ER 30%, PR 0%, HER-2/neu negative.   CKD (chronic kidney disease) stage 2, GFR 60-89 ml/min    Dementia (HCC)    Gum disease    HTN (hypertension)    Stroke [facility])     Past Surgical History:  Procedure Laterality Date   BREAST BIOPSY Right 2013   BREAST SURGERY Right July 29 2012   Modified radical mastectomy   PORTACATH PLACEMENT Left Dec 2013   [facility]   power port placement  2014    Social History:  reports that she has never smoked. Her smokeless tobacco use includes snuff. She reports that she does not drink alcohol and does not use drugs.  Family History:  Family History  Problem Relation Age of Onset   Stroke Mother    Hypertension Mother    Diabetes Sister    Diabetes Brother    Hypertension Brother      Prior to Admission medications   Medication Sig Start Date End Date Taking? Authorizing Provider  divalproex (DEPAKOTE SPRINKLE) 125  MG capsule Take 125 mg by mouth 2 (two) times daily. 02/10/22  Yes [provider]  hydrALAZINE (APRESOLINE) 10 MG tablet Take 10 mg by mouth 3 (three) times daily.   Yes [provider]  loperamide (IMODIUM A-D) 2 MG tablet Take by mouth 2 (two) times daily as needed. 11/13/21  Yes [provider]  losartan (COZAAR) 25 MG tablet Take 1 tablet (25 mg total) by mouth daily. 01/07/21 04/07/22 Yes Lavonia Drafts, MD  QUEtiapine (SEROQUEL) 25 MG tablet Take 25 mg by mouth at bedtime. 02/10/22  Yes [provider]  QUEtiapine (SEROQUEL) 25 MG tablet  Take 12.5 mg by mouth daily with lunch.   Yes [provider]  SENNA PLUS 8.6-50 MG tablet Take 1 tablet by mouth at bedtime as needed. 11/13/21  Yes [provider]  TGT PSYLLIUM FIBER 520 MG capsule Take 1 capsule by mouth in the morning and at bedtime. 11/13/21  Yes [provider]  acetaminophen (TYLENOL) 325 MG tablet Take 650 mg by mouth 3 (three) times daily. Patient not taking: Reported on 04/07/2022 03/26/22   [provider]  MUCINEX 600 MG 12 hr tablet Take 600 mg by mouth 2 (two) times daily. Patient not taking: Reported on 04/07/2022 03/26/22   [provider]    Physical Exam: Vitals:   04/07/22 1200 04/07/22 1218 04/07/22 1500 04/07/22 1505  BP: 118/60  (!) 173/77   Pulse: 60  61   Resp: 18  12   Temp:  97.6 F (36.4 C)  97.7 F (36.5 C)  TempSrc:  Axillary  Axillary  SpO2: 96%  98%   Weight:      Height:       General: Not in acute distress HEENT:       Eyes: PERRL, EOMI, no scleral icterus.       ENT: No discharge from the ears and nose       Neck: No JVD, no bruit, no mass felt. Heme: No neck lymph node enlargement. Cardiac: Currently, S1/S2, RRR, No murmurs, No gallops or rubs. Respiratory: No rales, wheezing, rhonchi or rubs. GI: Soft, nondistended, no organomegaly, BS present.  Pt seems to have abdominal pain on palpation? GU: No hematuria Ext: No pitting leg edema bilaterally. 1+DP/PT pulse bilaterally. Musculoskeletal: No joint deformities, No joint redness or warmth, no limitation of ROM in spin. Skin: No rashes.  Neuro: Confused,, not following command, not oriented X3, cranial nerves II-XII grossly intact, moves all extremities. Psych: Patient is not psychotic, no suicidal or hemocidal ideation.  Labs on Admission: I have personally reviewed following labs and imaging studies  CBC: Recent Labs  Lab 04/07/22 0840  WBC 8.4  NEUTROABS 6.3  HGB 13.8  HCT 43.2  MCV 94.3  PLT [id number]   Basic Metabolic  Panel: Recent Labs  Lab 04/07/22 0946  NA 146*  K 4.1  CL 107  CO2 28  GLUCOSE 131*  BUN 55*  CREATININE 1.67*  CALCIUM 8.8*   GFR: Estimated Creatinine Clearance: 17.3 mL/min (A) (by C-G formula based on SCr of 1.67 mg/dL (H)). Liver Function Tests: Recent Labs  Lab 04/07/22 0946  AST 79*  ALT 18  ALKPHOS 87  BILITOT 0.7  PROT 8.0  ALBUMIN 3.0*   No results for input(s): "LIPASE", "AMYLASE" in the last 168 hours. No results for input(s): "AMMONIA" in the last 168 hours. Coagulation Profile: Recent Labs  Lab 04/07/22 0840  INR 1.0   Cardiac Enzymes: No results for input(s): "CKTOTAL", "  CKMB", "CKMBINDEX", "TROPONINI" in the last 168 hours. BNP (last 3 results) No results for input(s): "PROBNP" in the last 8760 hours. HbA1C: No results for input(s): "HGBA1C" in the last 72 hours. CBG: No results for input(s): "GLUCAP" in the last 168 hours. Lipid Profile: No results for input(s): "CHOL", "HDL", "LDLCALC", "TRIG", "CHOLHDL", "LDLDIRECT" in the last 72 hours. Thyroid Function Tests: Recent Labs    04/07/22 0840  TSH 2.703   Anemia Panel: No results for input(s): "VITAMINB12", "FOLATE", "FERRITIN", "TIBC", "IRON", "RETICCTPCT" in the last 72 hours. Urine analysis:    Component Value Date/Time   COLORURINE AMBER (A) 04/07/2022 0837   APPEARANCEUR CLOUDY (A) 04/07/2022 0837   LABSPEC 1.028 04/07/2022 0837   PHURINE 5.0 04/07/2022 0837   GLUCOSEU NEGATIVE 04/07/2022 0837   HGBUR NEGATIVE 04/07/2022 0837   BILIRUBINUR NEGATIVE 04/07/2022 0837   KETONESUR 5 (A) 04/07/2022 0837   PROTEINUR 100 (A) 04/07/2022 0837   NITRITE NEGATIVE 04/07/2022 0837   LEUKOCYTESUR TRACE (A) 04/07/2022 0837   Sepsis Labs: '@LABRCNTIP'$ (procalcitonin:4,lacticidven:4) ) Recent Results (from the past 240 hour(s))  Resp panel by RT-PCR (RSV, Flu A&B, Covid) Anterior Nasal Swab     Status: Abnormal   Collection Time: 04/07/22  8:55 AM   Specimen: Anterior Nasal Swab  Result Value  Ref Range Status   SARS Coronavirus 2 by RT PCR POSITIVE (A) NEGATIVE Final    Comment: (NOTE) SARS-CoV-2 target nucleic acids are DETECTED.  The SARS-CoV-2 RNA is generally detectable in upper respiratory specimens during the acute phase of infection. Positive results are indicative of the presence of the identified virus, but do not rule out bacterial infection or co-infection with other pathogens not detected by the test. Clinical correlation with patient history and other diagnostic information is necessary to determine patient infection status. The expected result is Negative.  Fact Sheet for Patients: EntrepreneurPulse.com.au  Fact Sheet for Healthcare Providers: IncredibleEmployment.be  This test is not yet approved or cleared by the [place] FDA and  has been authorized for detection and/or diagnosis of SARS-CoV-2 by FDA under an Emergency Use Authorization (EUA).  This EUA will remain in effect (meaning this test can be used) for the duration of  the COVID-19 declaration under Section 564(b)(1) of the A ct, 21 U.S.C. section 360bbb-3(b)(1), unless the authorization is terminated or revoked sooner.     Influenza A by PCR NEGATIVE NEGATIVE Final   Influenza B by PCR NEGATIVE NEGATIVE Final    Comment: (NOTE) The Xpert Xpress SARS-CoV-2/FLU/RSV plus assay is intended as an aid in the diagnosis of influenza from Nasopharyngeal swab specimens and should not be used as a sole basis for treatment. Nasal washings and aspirates are unacceptable for Xpert Xpress SARS-CoV-2/FLU/RSV testing.  Fact Sheet for Patients: EntrepreneurPulse.com.au  Fact Sheet for Healthcare Providers: IncredibleEmployment.be  This test is not yet approved or cleared by the [place] FDA and has been authorized for detection and/or diagnosis of SARS-CoV-2 by FDA under an Emergency Use Authorization (EUA). This EUA will  remain in effect (meaning this test can be used) for the duration of the COVID-19 declaration under Section 564(b)(1) of the Act, 21 U.S.C. section 360bbb-3(b)(1), unless the authorization is terminated or revoked.     Resp Syncytial Virus by PCR NEGATIVE NEGATIVE Final    Comment: (NOTE) Fact Sheet for Patients: EntrepreneurPulse.com.au  Fact Sheet for Healthcare Providers: IncredibleEmployment.be  This test is not yet approved or cleared by the [place] FDA and has been authorized for detection and/or diagnosis of  SARS-CoV-2 by FDA under an Emergency Use Authorization (EUA). This EUA will remain in effect (meaning this test can be used) for the duration of the COVID-19 declaration under Section 564(b)(1) of the Act, 21 U.S.C. section 360bbb-3(b)(1), unless the authorization is terminated or revoked.  Performed at [facility], [address], [place], [place] [address]      Radiological Exams on Admission: US Venous Img Lower Bilateral (DVT)  Result Date: 04/07/2022 CLINICAL DATA:  Positive D-dimer EXAM: Bilateral LOWER EXTREMITY VENOUS DOPPLER ULTRASOUND TECHNIQUE: Gray-scale sonography with compression, as well as color and duplex ultrasound, were performed to evaluate the deep venous system(s) from the level of the common femoral vein through the popliteal and proximal calf veins. COMPARISON:  None Available. FINDINGS: VENOUS Normal compressibility of the common femoral, superficial femoral, and popliteal veins, as well as the visualized calf veins. Visualized portions of profunda femoral vein and great saphenous vein unremarkable. No filling defects to suggest DVT on grayscale or color Doppler imaging. Doppler waveforms show normal direction of venous flow, normal respiratory plasticity and response to augmentation. Limited views of the contralateral common femoral vein are unremarkable. OTHER None. Limitations: Patient  positioning. Patient has dementia and had difficulty cooperating as per the sonographer Electronically Signed   By: Jill Side M.D.   On: 04/07/2022 17:49   NM Pulmonary Perfusion  Result Date: 04/07/2022 CLINICAL DATA:  Patient tested positive for COVID 10 days ago with generalized weakness and altered mental status in the setting of shortness of breath EXAM: NUCLEAR MEDICINE PERFUSION LUNG SCAN TECHNIQUE: Perfusion images were obtained in multiple projections after intravenous injection of radiopharmaceutical. Ventilation scans intentionally deferred if perfusion scan and chest x-ray adequate for interpretation during COVID 19 epidemic. RADIOPHARMACEUTICALS:  4.37 mCi Tc-74mMAA IV COMPARISON:  Chest radiograph dated 04/07/2022 FINDINGS: Mildly heterogeneous distribution of radiotracer with an area of decreased radiotracer distribution along the superior segment right lower lobe without definite wedge-shaped perfusion defect. IMPRESSION: Pulmonary embolism absent. Heterogeneous radiotracer distribution likely corresponds to sequela of recent COVID infection. Electronically Signed   By: LDarrin NipperM.D.   On: 04/07/2022 14:53   CT HEAD WO CONTRAST (5MM)  Result Date: 04/07/2022 CLINICAL DATA:  Weakness and altered mental status EXAM: CT HEAD WITHOUT CONTRAST TECHNIQUE: Contiguous axial images were obtained from the base of the skull through the vertex without intravenous contrast. RADIATION DOSE REDUCTION: This exam was performed according to the departmental dose-optimization program which includes automated exposure control, adjustment of the mA and/or kV according to patient size and/or use of iterative reconstruction technique. COMPARISON:  06/15/2007 CT.  MRI 01/11/2019 FINDINGS: Brain: No focal abnormality is seen affecting the brainstem or cerebellum. Chronic small-vessel ischemic changes affect the pons. Cerebral hemispheres show old infarctions in the inferior right frontal lobe, right parietal  lobe, superior left frontal lobe and superior left parietal lobe. These were all present in December of 2022, except the inferior right frontal stroke which is new since then but clearly old. No evidence of mass lesion, hemorrhage, hydrocephalus or extra-axial collection. Vascular: There is atherosclerotic calcification of the major vessels at the base of the brain. Skull: Negative Sinuses/Orbits: Widespread inflammatory opacification of the paranasal sinuses. Orbits negative. Other: None IMPRESSION: 1. No acute CT finding. Multiple old infarctions as outlined above. 2. Widespread inflammatory opacification of the paranasal sinuses. Electronically Signed   By: MNelson ChimesM.D.   On: 04/07/2022 14:48   DG Chest Port 1 View  Result Date: 04/07/2022 CLINICAL DATA:  Sepsis.  EXAM: PORTABLE CHEST 1 VIEW COMPARISON:  October 02, 2015. FINDINGS: Stable cardiomediastinal silhouette. Left internal jugular Port-A-Cath is unchanged in position. Both lungs are clear. The visualized skeletal structures are unremarkable. IMPRESSION: No active disease. Aortic Atherosclerosis (ICD10-I70.0). Electronically Signed   By: Marijo Conception M.D.   On: 04/07/2022 08:56      Assessment/Plan Principal Problem:   NSTEMI (non-ST elevated myocardial infarction) [facility]) Active Problems:   New onset atrial fibrillation (HCC)   Stroke ([place])   HTN (hypertension)   Acute renal failure superimposed on stage 2 chronic kidney disease ([place])   COVID-19 virus infection   Severe sepsis (HCC)   UTI (urinary tract infection)   Hypernatremia   Hypothermia   Acute metabolic encephalopathy   Abnormal LFTs   Dementia (HCC)   Protein-calorie malnutrition, moderate (HCC)   Assessment and Plan:  NSTEMI (non-ST elevated myocardial infarction) ([place]): Trop  6282 --> 4924. Pt has positive d-dimer, with recent Covid infection. Will need to r/o.PE.  - admit to progressive unit as inpatient - IV heparin - Trend Trop - Aspirin, lipitor  -  Risk factor stratification: will check FLP and A1C  - 2d echo - V/Q scan --> negative for PE - LE doppler to r/o DVT - Consulted Dr. Fletcher Anon of card  New onset atrial fibrillation with RVR: CHA2DS2-VASc at least 7. HR 147 --> 68. TSH 2.703 -on IV heparin -tele monitoring  Hx of Stroke (HCC) -ASA and lipitor  HTN (hypertension): -Hold [place] due to worsening renal function -Continue home hydralazine -IV hydralazine as needed  Acute renal failure superimposed on stage 2 chronic kidney disease ([place]):  -Hold Cozaar -IV fluid as below  COVID-19 virus infection: out of window for antiviral treatment -Bronchodilators and as needed Mucinex  Severe sepsis due to possible UTI: Urinalysis is cloudy appearance, trace amount of leukocyte, negative bacteria, WBC 6-10.  Patient meets criteria for severe sepsis with tachycardia, tachypnea with RR 23 patient has elevated lactic acid 3.8 --> 6.1.  Procalcitonin level 2.63 -IV hydralazine as needed -Follow-up blood culture and urine culture -Trend lactic acid level -IV fluid: Total of 2.5 L LR, then 75 cc/h  Hypernatremia: Likely due to dehydration -IV fluid as above  Hypothermia: Body temperature 95.6 --> 0000000  Acute metabolic encephalopathy: Possibly due to ongoing infection.  CT head negative. -Frequent neurocheck -Fall precaution  Abnormal LFTs: mild. ALP 87, AST 79, ALT 18, total bilirubin 0.7, likely due to ongoing infection.  Patient seems to have abdominal pain on examination -Careful use Tylenol (patient cannot use NSAIDs due to worsening renal function) -CT abdomen/pelvis  Questionable deformity along the left lateral shin: -F/u X-ray of left fibular/tibia  Dementia [facility]) -Fall precaution -Continue home Depakote, Seroquel  Protein-calorie malnutrition, moderate ([place]): Body weight 49 kg, BMI 19.76 -Nutrition consult -Ensure    DVT ppx: On Heparin     Code Status: DNR per her daughter, OK to use vasopressor if  needed  Family Communication:  Yes, patient's daughter and granddaughter   at bed side.    Disposition Plan:  Anticipate discharge back to previous environment  Consults called:  Dr. Fletcher Anon of Card  Admission status and Level of care: Progressive:   as inpt    Dispo: The patient is from: SNF              Anticipated d/c is to: SNF              Anticipated d/c date is: 2 days  Patient currently is not medically stable to d/c.    Severity of Illness:  The appropriate patient status for this patient is INPATIENT. Inpatient status is judged to be reasonable and necessary in order to provide the required intensity of service to ensure the patient's safety. The patient's presenting symptoms, physical exam findings, and initial radiographic and laboratory data in the context of their chronic comorbidities is felt to place them at high risk for further clinical deterioration. Furthermore, it is not anticipated that the patient will be medically stable for discharge from the hospital within 2 midnights of admission.   * I certify that at the point of admission it is my clinical judgment that the patient will require inpatient hospital care spanning beyond 2 midnights from the point of admission due to high intensity of service, high risk for further deterioration and high frequency of surveillance required.*       Date of Service 04/07/2022    Ivor Costa [facility]   If 7PM-7AM, please contact night-coverage [email or web address] 04/07/2022, 6:19 PM
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
## 2022-04-07 NOTE — ED Notes (Signed)
Patient had large BM. Patient cleaned due to incontinence. New diaper placed on patient.

## 2022-04-07 NOTE — Consult Note (Addendum)
Cardiology Consultation:   Patient ID: Sonya Bowman; [id number]; 04-15-1930   Admit date: 04/07/2022 Date of Consult: 04/07/2022  Primary Care Provider: Pcp, No Primary Cardiologist: New - consult by Fletcher Anon Primary Electrophysiologist:  None   Patient Profile:   Sonya Bowman is a 89 y.o. female with a hx of CVA with cerebrovascular disease, dementia, breast cancer status post mastectomy and chemoradiation, HTN, and recent COVID illness who is being seen today for the evaluation of Afib at the request of Dr. Blaine Hamper.  History of Present Illness:   Ms. Sheilds underwent echo in 2013 through [place] which showed normal LV systolic function, normal wall motion, normal RV systolic function, and trivial mitral regurgitation.  She was diagnosed with COVID 10 days prior.  EMS was called due to generalized weakness and altered mental status.  In the field, she was found to be in A-fib with RVR and would briefly spontaneously converted to sinus rhythm with rates in the 50s bpm.  Upon arrival to [facility] ED the patient was back in A-fib with RVR with ventricular rates in the 140s bpm.  The patient's daughter arrived and indicated her mother was at her baseline mental state.  Labs notable for an additional high-sensitivity troponin of 6282 with a delta troponin pending, BNP 908, D-dimer 1.36, BUN 55, serum creatinine 0.67, potassium 4.1, BUN 3.0, AST 79, ALT normal, CBC unremarkable.  Chest x-ray without acute cardiopulmonary process with aortic atherosclerosis noted.  In the ED, she was given ASA 324 mg x 1, Rocephin, and LR.  The patient spontaneously converted to sinus rhythm at 8:43 AM and has maintained sinus rhythm since.  Upon admission, internal medicine started a heparin drip.  Patient's granddaughter at bedside, who assists with HPI.      Past Medical History:  Diagnosis Date   Cancer ([place]) 11/2011   ypT4b, ypN0, 9.5 cm right breast tumor, 3/18 nodes showing effective neoadjuvant chemotherapy.  ER 30%, PR 0%, HER-2/neu negative.   CKD (chronic kidney disease) stage 2, GFR 60-89 ml/min    Dementia (HCC)    Gum disease    HTN (hypertension)    Stroke [facility])     Past Surgical History:  Procedure Laterality Date   BREAST BIOPSY Right 2013   BREAST SURGERY Right July 29 2012   Modified radical mastectomy   PORTACATH PLACEMENT Left Dec 2013   [facility]   power port placement  2014     Home Meds: Prior to Admission medications   Medication Sig Start Date End Date Taking? Authorizing Provider  divalproex (DEPAKOTE SPRINKLE) 125 MG capsule Take 125 mg by mouth 2 (two) times daily. 02/10/22  Yes [provider]  hydrALAZINE (APRESOLINE) 10 MG tablet Take 10 mg by mouth 3 (three) times daily.   Yes [provider]  loperamide (IMODIUM A-D) 2 MG tablet Take by mouth 2 (two) times daily as needed. 11/13/21  Yes [provider]  losartan (COZAAR) 25 MG tablet Take 1 tablet (25 mg total) by mouth daily. 01/07/21 04/07/22 Yes Lavonia Drafts, MD  QUEtiapine (SEROQUEL) 25 MG tablet Take 25 mg by mouth at bedtime. 02/10/22  Yes [provider]  QUEtiapine (SEROQUEL) 25 MG tablet Take 12.5 mg by mouth daily with lunch.   Yes [provider]  SENNA PLUS 8.6-50 MG tablet Take 1 tablet by mouth at bedtime as needed. 11/13/21  Yes [provider]  TGT PSYLLIUM FIBER 520 MG capsule Take 1 capsule by mouth in the morning and at  bedtime. 11/13/21  Yes [provider]  acetaminophen (TYLENOL) 325 MG tablet Take 650 mg by mouth 3 (three) times daily. Patient not taking: Reported on 04/07/2022 03/26/22   [provider]  MUCINEX 600 MG 12 hr tablet Take 600 mg by mouth 2 (two) times daily. Patient not taking: Reported on 04/07/2022 03/26/22   [provider]    Inpatient Medications: Scheduled Meds:  [START ON 04/08/2022] aspirin EC  81 mg Oral Daily   atorvastatin  40 mg Oral Daily   heparin  3,000 Units Intravenous Once   Continuous  Infusions:  heparin     PRN Meds: acetaminophen, acetaminophen, albuterol, dextromethorphan-guaiFENesin, hydrALAZINE, ondansetron (ZOFRAN) IV  Allergies:  No Known Allergies  Social History:   Social History   Socioeconomic History   Marital status: Single    Spouse name: Not on file   Number of children: Not on file   Years of education: Not on file   Highest education level: Not on file  Occupational History   Not on file  Tobacco Use   Smoking status: Never   Smokeless tobacco: Current    Types: Snuff  Substance and Sexual Activity   Alcohol use: No   Drug use: No   Sexual activity: Not on file  Other Topics Concern   Not on file  Social History Narrative   Not on file   Social Determinants of Health   Financial Resource Strain: Not on file  Food Insecurity: Not on file  Transportation Needs: Not on file  Physical Activity: Not on file  Stress: Not on file  Social Connections: Not on file  Intimate Partner Violence: Not on file     Family History:   Family History  Problem Relation Age of Onset   Stroke Mother    Hypertension Mother    Diabetes Sister    Diabetes Brother    Hypertension Brother     ROS:  Review of Systems  Unable to perform ROS: Dementia      Physical Exam/Data:   Vitals:   04/07/22 0921 04/07/22 0927 04/07/22 0930 04/07/22 1000  BP:  (!) 153/73 113/66 (!) 164/75  Pulse: (!) 109 (!) 109 60 68  Resp:  13 (!) 22 18  Temp:      TempSrc:      SpO2: 95% 95%  92%  Weight:      Height:        Intake/Output Summary (Last 24 hours) at 04/07/2022 1204 Last data filed at 04/07/2022 0912 Gross per 24 hour  Intake 500 ml  Output --  Net 500 ml   Filed Weights   04/07/22 0855  Weight: 49 kg   Body mass index is 19.76 kg/m.   Physical Exam: General: Frail and elderly appearing, in no acute distress. Head: Normocephalic, atraumatic, sclera non-icteric, no xanthomas, nares without discharge.  Neck: Negative for carotid bruits.  JVD not elevated. Lungs: Diminished breath sounds bilaterally with poor inspiratory effort.  Breathing is unlabored. Heart: RRR with S1 S2. No murmurs, rubs, or gallops appreciated. Abdomen: Soft, non-tender, non-distended with normoactive bowel sounds. No hepatomegaly. No rebound/guarding. No obvious abdominal masses. Msk:  Strength and tone appear normal for age. Extremities: No clubbing or cyanosis. No edema. Distal pedal pulses are 2+ and equal bilaterally.  Small area of erythema along the lateral aspect of the left knee. Neuro: Somnolent and does not respond to questions. No facial asymmetry. No focal deficit. Psych: Somnolent.   EKG:  The EKG was  personally reviewed and demonstrates: A-fib with RVR, 165 bpm, LVH with early repolarization abnormality, and nonspecific ST-T changes.  Repeat EKG showed ectopic atrial rhythm, 69 bpm, PAC, LVH, nonspecific ST-T changes Telemetry:  Telemetry was personally reviewed and demonstrates: A-fib with RVR converting to sinus rhythm at 8:43 AM on 04/07/2022, maintaining sinus rhythm since with artifact  Weights: Filed Weights   04/07/22 0855  Weight: 49 kg    Relevant CV Studies:  2D echo 12/26/2011 ([facility]): NTERPRETATION ---------------------------------------------------------------    NORMAL LEFT VENTRICULAR SYSTOLIC FUNCTION    NORMAL RIGHT VENTRICULAR SYSTOLIC FUNCTION    VALVULAR REGURGITATION: TRIVIAL MR, MILD TR    NO VALVULAR STENOSIS    NO PRIOR STUDY FOR COMPARISON   Laboratory Data:  Chemistry Recent Labs  Lab 04/07/22 0946  NA 146*  K 4.1  CL 107  CO2 28  GLUCOSE 131*  BUN 55*  CREATININE 1.67*  CALCIUM 8.8*  GFRNONAA 29*  ANIONGAP 11    Recent Labs  Lab 04/07/22 0946  PROT 8.0  ALBUMIN 3.0*  AST 79*  ALT 18  ALKPHOS 87  BILITOT 0.7   Hematology Recent Labs  Lab 04/07/22 0840  WBC 8.4  RBC 4.58  HGB 13.8  HCT 43.2  MCV 94.3  MCH 30.1  MCHC 31.9  RDW 13.9  PLT 378   Cardiac EnzymesNo results for  input(s): "TROPONINI" in the last 168 hours. No results for input(s): "TROPIPOC" in the last 168 hours.  BNP Recent Labs  Lab 04/07/22 0904  BNP 908.4*    DDimer  Recent Labs  Lab 04/07/22 0840  DDIMER 1.36*    Radiology/Studies:  DG Chest Port 1 View  Result Date: 04/07/2022 IMPRESSION: No active disease. Aortic Atherosclerosis (ICD10-I70.0). Electronically Signed   By: Marijo Conception M.D.   On: 04/07/2022 08:56    Assessment and Plan:   1. Afib with RVR: -Spontaneously converted to sinus rhythm at 8:43 AM on 2/26 and has maintained sinus rhythm since -Currently, heart rates are in the low 60s bpm and preclude addition of beta-blocker -Heparin drip -CHA2DS2-VASc at least 7 (HTN, age x 2, stroke x 2, vascular disease, sex category) -Doubtful patient would be a good candidate for long-term [place] given advanced age, frail state, and fall risk -TSH normal -Potassium at goal  2. NSTEMI: -Unable to determine if there are anginal symptoms due to baseline dementia -Initial and peak high-sensitivity troponin 6282 with a delta troponin of 4924 -Heparin drip -Obtain echo -Patient is not a good candidate for invasive ischemic evaluation due to advanced age, dementia, frail state, and AKI of uncertain chronicity  3. AKI: -Chronicity of underlying renal dysfunction uncertain (prior creatinine 1.0 from 2022) -Trend  Remaining abnormalities and comorbid conditions per primary service.  Patient would benefit from palliative care consultation to discuss goals of care with family.        For questions or updates, please contact [place] Please consult [email or web address] for contact info under Cardiology/STEMI.   Signed, Christell Faith, PA-C [facility] Pager: [phone] 04/07/2022, 12:04 PM

## 2022-04-07 NOTE — ED Notes (Signed)
Patient attempting to pull IV out

## 2022-04-08 ENCOUNTER — Other Ambulatory Visit: Payer: Medicare HMO

## 2022-04-08 ENCOUNTER — Other Ambulatory Visit (HOSPITAL_COMMUNITY): Payer: Self-pay

## 2022-04-08 ENCOUNTER — Encounter: Payer: Self-pay | Admitting: Internal Medicine

## 2022-04-08 DIAGNOSIS — I480 Paroxysmal atrial fibrillation: Secondary | ICD-10-CM | POA: Diagnosis not present

## 2022-04-08 DIAGNOSIS — I214 Non-ST elevation (NSTEMI) myocardial infarction: Secondary | ICD-10-CM | POA: Diagnosis not present

## 2022-04-08 LAB — COMPREHENSIVE METABOLIC PANEL
ALT: 15 U/L (ref 0–44)
AST: 51 U/L — ABNORMAL HIGH (ref 15–41)
Albumin: 2.5 g/dL — ABNORMAL LOW (ref 3.5–5.0)
Alkaline Phosphatase: 74 U/L (ref 38–126)
Anion gap: 9 (ref 5–15)
BUN: 42 mg/dL — ABNORMAL HIGH (ref 8–23)
CO2: 26 mmol/L (ref 22–32)
Calcium: 8.3 mg/dL — ABNORMAL LOW (ref 8.9–10.3)
Chloride: 106 mmol/L (ref 98–111)
Creatinine, Ser: 1.11 mg/dL — ABNORMAL HIGH (ref 0.44–1.00)
GFR, Estimated: 47 mL/min — ABNORMAL LOW (ref >60)
Glucose, Bld: 91 mg/dL (ref 70–99)
Potassium: 3.7 mmol/L (ref 3.5–5.1)
Sodium: 141 mmol/L (ref 135–145)
Total Bilirubin: 0.9 mg/dL (ref 0.3–1.2)
Total Protein: 6.8 g/dL (ref 6.5–8.1)

## 2022-04-08 LAB — CBC
HCT: 34.6 % — ABNORMAL LOW (ref 36.0–46.0)
Hemoglobin: 11.2 g/dL — ABNORMAL LOW (ref 12.0–15.0)
MCH: 30.2 pg (ref 26.0–34.0)
MCHC: 32.4 g/dL (ref 30.0–36.0)
MCV: 93.3 fL (ref 80.0–100.0)
Platelets: 287 10*3/uL (ref 150–400)
RBC: 3.71 MIL/uL — ABNORMAL LOW (ref 3.87–5.11)
RDW: 13.9 % (ref 11.5–15.5)
WBC: 8.8 10*3/uL (ref 4.0–10.5)
nRBC: 0 % (ref 0.0–0.2)

## 2022-04-08 LAB — HEMOGLOBIN A1C
Hgb A1c MFr Bld: 5.7 % — ABNORMAL HIGH (ref 4.8–5.6)
Mean Plasma Glucose: 117 mg/dL

## 2022-04-08 LAB — LIPID PANEL
Cholesterol: 181 mg/dL (ref 0–200)
HDL: 44 mg/dL (ref >40)
LDL Cholesterol: 121 mg/dL — ABNORMAL HIGH (ref 0–99)
Total CHOL/HDL Ratio: 4.1 RATIO
Triglycerides: 78 mg/dL (ref <150)
VLDL: 16 mg/dL (ref 0–40)

## 2022-04-08 LAB — TROPONIN I (HIGH SENSITIVITY)
Troponin I (High Sensitivity): 7152 ng/L (ref <18)
Troponin I (High Sensitivity): 3761 ng/L (ref <18)

## 2022-04-08 LAB — PROCALCITONIN: Procalcitonin: 1 ng/mL

## 2022-04-08 LAB — HEPARIN LEVEL (UNFRACTIONATED): Heparin Unfractionated: 0.18 IU/mL — ABNORMAL LOW (ref 0.30–0.70)

## 2022-04-08 LAB — LACTIC ACID, PLASMA: Lactic Acid, Venous: 2.5 mmol/L (ref 0.5–1.9)

## 2022-04-08 MED ORDER — ENOXAPARIN SODIUM 60 MG/0.6ML IJ SOSY
1.0000 mg/kg | PREFILLED_SYRINGE | INTRAMUSCULAR | Status: DC
  Administered 2022-04-08 – 2022-04-09 (×2): 60 mg via SUBCUTANEOUS
  Filled 2022-04-08 (×2): qty 0.6

## 2022-04-08 MED ORDER — HALOPERIDOL LACTATE 5 MG/ML IJ SOLN
4.0000 mg | Freq: Once | INTRAMUSCULAR | Status: AC | PRN
  Administered 2022-04-08: 4 mg via INTRAVENOUS

## 2022-04-08 MED ORDER — HEPARIN BOLUS VIA INFUSION
1450.0000 [IU] | Freq: Once | INTRAVENOUS | Status: AC
  Administered 2022-04-08: 1450 [IU] via INTRAVENOUS
  Filled 2022-04-08: qty 1450

## 2022-04-08 NOTE — Assessment & Plan Note (Signed)
ALP 87, AST 79, ALT 18, total bilirubin 0.7, likely due to ongoing infection.  there was concern for some abdominal pain on admission exam.   CT abdomen/pelvis showed fecal impaction and developing stercoral colitis, no bowel obstruction, non-obstructing b/l renal calculi, no hydronephrosis.  No other acute findings. --Follow LFT's

## 2022-04-08 NOTE — Assessment & Plan Note (Signed)
Out of window to benefit from anti-viral therapy. Supportive care as needed, per orders - bronchodilators, mucolytic
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
## 2022-04-08 NOTE — Assessment & Plan Note (Signed)
Urinalysis is cloudy appearance, trace amount of leukocyte, negative bacteria, WBC 6-10.  Patient meets criteria for severe sepsis with tachycardia, tachypnea with RR 23.  Procalcitonin level 2.63 Lactic acidosis 3.8 --> 6.1 (more likely dehydration than septic shock).  Treated per sepsis protocol on admission with IV fluids and IV antibiotics --Continue Rocephin --Follow-up blood culture and urine culture --Trend lactic acid level --Continue IV fluids

## 2022-04-08 NOTE — TOC Initial Note (Signed)
Transition of Care [facility]) - Initial/Assessment Note    Patient Details  Name: Sonya Bowman MRN: [id number] Date of Birth: 1930/11/25  [facility]) CM/SW Contact:    Laurena Slimmer, RN Phone Number: 04/08/2022, 12:51 PM  Clinical Narrative:                 Patient is from [facility].  Case reviewed for DME and disposition changes        Patient Goals and CMS Choice            Expected Discharge Plan and Services                                              Prior Living Arrangements/Services                       Activities of Daily Living Home Assistive Devices/Equipment: Other (Comment) (assisted living) ADL Screening (condition at time of admission) Patient's cognitive ability adequate to safely complete daily activities?: No Is the patient deaf or have difficulty hearing?: Yes Does the patient have difficulty seeing, even when wearing glasses/contacts?: Yes Does the patient have difficulty concentrating, remembering, or making decisions?: Yes Patient able to express need for assistance with ADLs?: Yes Does the patient have difficulty dressing or bathing?: Yes Independently performs ADLs?: No Does the patient have difficulty walking or climbing stairs?: Yes Weakness of Legs: Both Weakness of Arms/Hands: Both  Permission Sought/Granted                  Emotional Assessment              Admission diagnosis:  NSTEMI (non-ST elevated myocardial infarction) ([place]) [I21.4] Atrial fibrillation, rapid ([place]) [I48.91] Patient Active Problem List   Diagnosis Date Noted   NSTEMI (non-ST elevated myocardial infarction) ([place]) 04/07/2022   Stroke ([place]) 04/07/2022   HTN (hypertension) 04/07/2022   Dementia ([place]) 04/07/2022   Acute renal failure superimposed on stage 2 chronic kidney disease ([place]) 04/07/2022   New onset atrial fibrillation ([place]) 04/07/2022   COVID-19 virus infection 04/07/2022   Hypernatremia 04/07/2022    Hypothermia 04/07/2022   Abnormal LFTs 0000000   Acute metabolic encephalopathy 0000000   Protein-calorie malnutrition, moderate ([place]) 04/07/2022   Elevated lactic acid level 04/07/2022   Severe sepsis ([place]) 04/07/2022   UTI (urinary tract infection) 04/07/2022   Breast cancer, right ([place]) 05/11/2013   Breast cancer ([place]) 11/03/2012   Mass of breast, left 11/03/2012   Cancer ([place])    PCP:  Pcp, No Pharmacy:   [id number] [place] [address] [place]: [phone]: [place]#[id number]-[place]: [phone]: [phone]    Social Determinants of Health ([place] Social History: SDOH Screenings   Food Insecurity: Unknown (04/07/2022)  Housing: Low Risk  (04/07/2022)  Transportation Needs: Unknown (04/07/2022)  Utilities: Unknown (04/07/2022)  Tobacco Use: High Risk (04/08/2022)   SDOH Interventions:     Readmission Risk Interventions     No data to display

## 2022-04-08 NOTE — Assessment & Plan Note (Signed)
Appears due to Covid-19 infection, possible fecal impaction contributing.  Head CT was non-acute. --Mgmt of underlying issues as outlined --Delirium and fall precautions --Soft restraints for safety as needed --Safety sitter as needed --Presence of family at bedside as much as possible --Neuro checks

## 2022-04-08 NOTE — Assessment & Plan Note (Signed)
POA, resolved.

## 2022-04-08 NOTE — Assessment & Plan Note (Signed)
Prior hx of stroke.   On ASA and Lipitor
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
## 2022-04-08 NOTE — Progress Notes (Signed)
Progress Note   Patient: Sonya Bowman [id number] DOB: 08-Dec-1930 DOA: 04/07/2022     1 DOS: the patient was seen and examined on 04/08/2022   Brief hospital course: ANYLIA MADSON is a 89 y.o. female with medical history significant of stroke, HTN, dementia, CKD-2, breast cancer (s/p of right mastectomy, chemo and radiation therapy), positive Covid 19 test at 7 days ago, who presented on 04/07/2022 for evaluation of generalized weakness and altered mental status.  Per her daughter at bedside on admission, pt tested positive for COVID about 1 week ago.  She had cough, no apparent dyspnea.  Increased confusion from baseline, can typically recognize family and oriented to self, not always oriented to place or time.  Day of admission, pt unable to recognize her daughter.  Pt unable to provide history or symptoms due to dementia.   Due to severe agitation initially, and she received 1 dose of Versed in the ED, and was then calm and sleeping.   2/27 -- ongoing issues with combativeness, pt hitting and biting nurses, spitting at them, refuses oral medications.  Soft restraints had to be initiated for safety.  Seen by cardiology.  Not a candidate for cath due to agitation and advanced dementia, frailty.  Medical management of her NSTEMI is limited by refusing medications.  IV heparin was changed to full dose Lovenox to reduce need for frequent blood draws.   Assessment and Plan: * NSTEMI (non-ST elevated myocardial infarction) ([place]) POA - unclear if chest pain due to dementia. --Mgmt per Cardiology --IV heparin changed to full dose Lovenox to minimize blood draws given severe agitation --Follow up Echo --No plan for cath due to advanced age, dementia with agitation, frail state, and AKI of uncertain chronicity   New onset atrial fibrillation ([place]) POA - Spontaneously converted to NSR on 2/26 AM. 2/27 - in sinus tach --Mgmt per cardiology --Full dose Lovenox --Risk/benefit of long term  coagulation needs to be considered, may be poor candidate given dementia causing issues with med compliance, debility high falls risk. --Follow TSH  Stroke [facility]) Prior hx of stroke.   On ASA and Lipitor  HTN (hypertension) --Hold Cozarr due to worsening renal function --Continue home hydralazine --IV hydralazine as needed  Acute renal failure superimposed on stage 2 chronic kidney disease ([place]) CKD stage II Likely due to hypovolemia, dehydration. --Continue IV fluids --Monitor BMP  UTI (urinary tract infection) Continue Rocephin Follow cultures  Severe sepsis (HCC) Urinalysis is cloudy appearance, trace amount of leukocyte, negative bacteria, WBC 6-10.  Patient meets criteria for severe sepsis with tachycardia, tachypnea with RR 23.  Procalcitonin level 2.63 Lactic acidosis 3.8 --> 6.1 (more likely dehydration than septic shock).  Treated per sepsis protocol on admission with IV fluids and IV antibiotics --Continue Rocephin --Follow-up blood culture and urine culture --Trend lactic acid level --Continue IV fluids  COVID-19 virus infection Out of window to benefit from anti-viral therapy. Supportive care as needed, per orders - bronchodilators, mucolytic  Hypernatremia Likely due to dehydration. Resolved with IV hydration --IV fluids as able --Encourage pt to drink water  Hypothermia POA, resolved.  Acute metabolic encephalopathy Appears due to Covid-19 infection, possible fecal impaction contributing.  Head CT was non-acute. --Mgmt of underlying issues as outlined --Delirium and fall precautions --Soft restraints for safety as needed --Safety sitter as needed --Presence of family at bedside as much as possible --Neuro checks  Abnormal LFTs ALP 87, AST 79, ALT 18, total bilirubin 0.7, likely due to ongoing infection.  there was concern for some abdominal pain on admission exam.   CT abdomen/pelvis showed fecal impaction and developing stercoral colitis, no bowel  obstruction, non-obstructing b/l renal calculi, no hydronephrosis.  No other acute findings. --Follow LFT's  Dementia ([place]) With Behavioral Disturbances - agitation, combativeness, hitting, biting and spitting at staff. Refusing oral meds. --see encephalopathy --continue home Depakote, Seroquel   Protein-calorie malnutrition, moderate ([place]) POA. Related to chronic illness including advanced dementia Optimize nutrition as able        Subjective: Pt very agitated this AM, hitting biting and spitting at nurses, refusing oral medications.  When seen, pt was sedated after medicated for this earlier.  She is unable to participate or express complaints.   Physical Exam: Vitals:   04/07/22 2130 04/07/22 2226 04/08/22 0131 04/08/22 0747  BP: (!) 167/65 (!) 162/60 (!) 153/91 (!) 180/79  Pulse:  64 67 83  Resp: '16 12 16 16  '$ Temp:  98.5 F (36.9 C) 99.1 F (37.3 C)   TempSrc:      SpO2: 99% 100% 97%   Weight:  59.5 kg  60.2 kg  Height:       General exam: awake, alert, no acute distress, frail, chronically ill-appearing HEENT: atraumatic, clear conjunctiva, anicteric sclera, moist mucus membranes, hearing grossly normal  Respiratory system: CTAB with diminished bases due to shallow inspirations, no wheezes, normal respiratory effort. Cardiovascular system: normal S1/S2, RRR, no JVD seen, no pedal edema.   Gastrointestinal system: soft, NT, ND Central nervous system: unable to examine, pt sedated and not able to follow commands Extremities: soft restraints of BUE's, no edema, normal tone Skin: dry, intact, normal temperature Psychiatry: unable to examine, pt sedated    Data Reviewed:  Notable labs --- Troponin down-trended 7152 >> 3761.  Lactic acid improving 6.1 >> 2.5.  CMP notable for BUN 42, Cr 1.11, Ca 8.3, albumin 2.5, AST 51. Lipid profile normal except LDL 121 Procal improving 2.63 >> 1.00 CBC with Hbg 11.2, no leukocytosis  Family Communication: None present. Will  attempt to call.  Disposition: Status is: Inpatient Remains inpatient appropriate because: Ongoing evaluation, severity of illness as above. Discharge will be pending cardiology clearance   Planned Discharge Destination: Home    Time spent: 38 minutes  Author: Ezekiel Slocumb, DO 04/08/2022 2:11 PM  For on call review [email or web address].

## 2022-04-08 NOTE — Assessment & Plan Note (Signed)
With Behavioral Disturbances - agitation, combativeness, hitting, biting and spitting at staff. Refusing oral meds. --see encephalopathy --continue home Depakote, Seroquel

## 2022-04-08 NOTE — Progress Notes (Signed)
ANTICOAGULATION CONSULT NOTE   Pharmacy Consult for IV heparin Indication: chest pain/ACS  No Known Allergies  Patient Measurements: Height: '5\' 2"'$  (157.5 cm) Weight: 59.5 kg (131 lb 2.8 oz) IBW/kg (Calculated) : 50.1 Heparin Dosing Weight: 49 kg  Vital Signs: Temp: 99.1 F (37.3 C) (02/27 0131) BP: 153/91 (02/27 0131) Pulse Rate: 67 (02/27 0131)  Labs: Recent Labs    04/07/22 0840 04/07/22 0946 04/07/22 1144 04/07/22 1945 04/07/22 2226 04/08/22 0028 04/08/22 0616  HGB 13.8  --   --   --   --   --  11.2*  HCT 43.2  --   --   --   --   --  34.6*  PLT 378  --   --   --   --   --  287  APTT 30  --   --   --   --   --   --   LABPROT 13.4  --   --   --   --   --   --   INR 1.0  --   --   --   --   --   --   HEPARINUNFRC  --   --   --  0.28*  --   --  0.18*  CREATININE  --  1.67*  --   --   --   --  1.11*  TROPONINIHS 6,282*  --    < > 6,701* 7,040* 7,152*  --    < > = values in this interval not displayed.     Estimated Creatinine Clearance: 26.6 mL/min (A) (by C-G formula based on SCr of 1.11 mg/dL (H)).   Medications:  No anticoagulation prior to admission  Assessment: 89 year old female presenting with altered mental status and tachycardia being admitted with ACS. PMH includes dementia and history of breast cancer.   Troponin > 6,000. Baseline H&H stable. Baseline aPTT 30  Date Time HL Rate/Comment 2/26 2000 0.28 Subtherapeutic / 600 > 700 u/hr 2/27     0616   0.18     Subtherapeutic / 700 > 800 un/hr  Goal of Therapy:  Heparin level 0.3-0.7 units/ml Monitor platelets by anticoagulation protocol: Yes    Plan:  2/27:  HL @ 0616 = 0.18, SUBtherpeutic Will order heparin 1450 units IV X 1 bolus and increase drip rate to 850 units/hr.  Check anti-Xa level in 8 hours and daily once consecutively therapeutic. Continue to monitor H&H and platelets daily while on heparin gtt.  Sabien Umland D Clinical Pharmacist 04/08/2022 6:55 AM

## 2022-04-08 NOTE — TOC Benefit Eligibility Note (Signed)
Patient Teacher, English as a foreign language completed.    The patient is currently admitted and upon discharge could be taking Eliquis 5 mg.  The current 30 day co-pay is $4.60.   The patient is insured through [place], [place] Patient Advocate Specialist [place] Patient Advocate Team Direct Number: [phone]  Fax: [phone]

## 2022-04-08 NOTE — Assessment & Plan Note (Signed)
POA - Spontaneously converted to NSR on 2/26 AM. 2/27 - in sinus tach --Mgmt per cardiology --Full dose Lovenox --Risk/benefit of long term coagulation needs to be considered, may be poor candidate given dementia causing issues with med compliance, debility high falls risk. --Follow TSH

## 2022-04-08 NOTE — Assessment & Plan Note (Signed)
Likely due to dehydration. Resolved with IV hydration --IV fluids as able --Encourage pt to drink water

## 2022-04-08 NOTE — Assessment & Plan Note (Signed)
CKD stage II Likely due to hypovolemia, dehydration. --Continue IV fluids --Monitor BMP

## 2022-04-08 NOTE — Progress Notes (Signed)
Pt physical with staff - kicking, pulling, biting, and spitting.  Unable to console pt.  MD notified.  New orders received.

## 2022-04-08 NOTE — Assessment & Plan Note (Signed)
POA - unclear if chest pain due to dementia. --Mgmt per Cardiology --IV heparin changed to full dose Lovenox to minimize blood draws given severe agitation --Follow up Echo --No plan for cath due to advanced age, dementia with agitation, frail state, and AKI of uncertain chronicity

## 2022-04-08 NOTE — Assessment & Plan Note (Signed)
POA. Related to chronic illness including advanced dementia Optimize nutrition as able

## 2022-04-08 NOTE — Progress Notes (Signed)
Progress Note  Patient Name: Sonya Bowman Date of Encounter: 04/08/2022  Primary Cardiologist: New - consult by End  Subjective   Agitated this morning.  Placed in soft restraints by clinical staff.  Sitter present in room.  Spit out oral medications this morning.  Inpatient Medications    Scheduled Meds:  aspirin EC  81 mg Oral Daily   atorvastatin  40 mg Oral Daily   divalproex  125 mg Oral BID   enoxaparin (LOVENOX) injection  1 mg/kg Subcutaneous Q24H   feeding supplement  237 mL Oral BID BM   hydrALAZINE  10 mg Oral TID   metoprolol succinate  12.5 mg Oral Daily   psyllium  1 packet Oral BID   QUEtiapine  12.5 mg Oral Q lunch   QUEtiapine  25 mg Oral QHS   Continuous Infusions:  cefTRIAXone (ROCEPHIN)  IV 2 g (04/08/22 0959)   lactated ringers 75 mL/hr at 04/08/22 0830   PRN Meds: acetaminophen, acetaminophen, albuterol, dextromethorphan-guaiFENesin, hydrALAZINE, loperamide, ondansetron (ZOFRAN) IV, senna-docusate   Vital Signs    Vitals:   04/07/22 2130 04/07/22 2226 04/08/22 0131 04/08/22 0747  BP: (!) 167/65 (!) 162/60 (!) 153/91 (!) 180/79  Pulse:  64 67 83  Resp: '16 12 16 16  '$ Temp:  98.5 F (36.9 C) 99.1 F (37.3 C)   TempSrc:      SpO2: 99% 100% 97%   Weight:  59.5 kg  60.2 kg  Height:        Intake/Output Summary (Last 24 hours) at 04/08/2022 1148 Last data filed at 04/08/2022 0701 Gross per 24 hour  Intake 692.98 ml  Output --  Net 692.98 ml   Filed Weights   04/07/22 0855 04/07/22 2226 04/08/22 0747  Weight: 49 kg 59.5 kg 60.2 kg    Telemetry    Sinus tachycardia with brief paroxysms of SVT versus A-fib - Personally Reviewed  ECG    No new tracing - Personally Reviewed  Physical Exam   GEN: Agitated, in no acute distress.   Neck: No JVD. Cardiac: Tachycardic, I/VI systolic murmur, no rubs, or gallops.  Respiratory: Poor inspiratory effort, clear to auscultation bilaterally.  GI: Soft, nontender, non-distended.   MS: No  edema; No deformity. Neuro: Agitated.  Psych: Agitated.  Labs    Chemistry Recent Labs  Lab 04/07/22 0946 04/08/22 0616  NA 146* 141  K 4.1 3.7  CL 107 106  CO2 28 26  GLUCOSE 131* 91  BUN 55* 42*  CREATININE 1.67* 1.11*  CALCIUM 8.8* 8.3*  PROT 8.0 6.8  ALBUMIN 3.0* 2.5*  AST 79* 51*  ALT 18 15  ALKPHOS 87 74  BILITOT 0.7 0.9  GFRNONAA 29* 47*  ANIONGAP 11 9     Hematology Recent Labs  Lab 04/07/22 0840 04/08/22 0616  WBC 8.4 8.8  RBC 4.58 3.71*  HGB 13.8 11.2*  HCT 43.2 34.6*  MCV 94.3 93.3  MCH 30.1 30.2  MCHC 31.9 32.4  RDW 13.9 13.9  PLT 378 287    Cardiac EnzymesNo results for input(s): "TROPONINI" in the last 168 hours. No results for input(s): "TROPIPOC" in the last 168 hours.   BNP Recent Labs  Lab 04/07/22 0904  BNP 908.4*     DDimer  Recent Labs  Lab 04/07/22 0840  DDIMER 1.36*     Radiology    CT ABDOMEN PELVIS WO CONTRAST  Result Date: 04/07/2022 IMPRESSION: 1. Constipation with findings suggestive of fecal impaction and developing stercoral colitis. No bowel  obstruction. 2. Nonobstructing bilateral renal calculi. No hydronephrosis. 3. Cholelithiasis. 4. Calcified uterine fibroids. 5.  Aortic Atherosclerosis (ICD10-I70.0). Electronically Signed   By: Anner Crete M.D.   On: 04/07/2022 19:13   DG Tibia/Fibula Left  Result Date: 04/07/2022 IMPRESSION: 1. Unremarkable left tibia and fibula. Electronically Signed   By: Randa Ngo M.D.   On: 04/07/2022 18:50   US Venous Img Lower Bilateral (DVT)  Result Date: 04/07/2022 FINDINGS: VENOUS Normal compressibility of the common femoral, superficial femoral, and popliteal veins, as well as the visualized calf veins. Visualized portions of profunda femoral vein and great saphenous vein unremarkable. No filling defects to suggest DVT on grayscale or color Doppler imaging. Doppler waveforms show normal direction of venous flow, normal respiratory plasticity and response to augmentation.  Limited views of the contralateral common femoral vein are unremarkable. OTHER None. Limitations: Patient positioning. Patient has dementia and had difficulty cooperating as per the sonographer Electronically Signed   By: Jill Side M.D.   On: 04/07/2022 17:49   NM Pulmonary Perfusion  Result Date: 04/07/2022 IMPRESSION: Pulmonary embolism absent. Heterogeneous radiotracer distribution likely corresponds to sequela of recent COVID infection. Electronically Signed   By: Darrin Nipper M.D.   On: 04/07/2022 14:53   CT HEAD WO CONTRAST (5MM)  Result Date: 04/07/2022 IMPRESSION: 1. No acute CT finding. Multiple old infarctions as outlined above. 2. Widespread inflammatory opacification of the paranasal sinuses. Electronically Signed   By: Nelson Chimes M.D.   On: 04/07/2022 14:48   DG Chest Port 1 View  Result Date: 04/07/2022 IMPRESSION: No active disease. Aortic Atherosclerosis (ICD10-I70.0). Electronically Signed   By: Marijo Conception M.D.   On: 04/07/2022 08:56    Cardiac Studies   2D echo pending  Patient Profile     89 y.o. female with history of CVA with cerebrovascular disease, dementia, breast cancer status post mastectomy and chemoradiation, HTN, and recent COVID illness who is being seen today for the evaluation of Afib at the request of Dr. Blaine Hamper.   Assessment & Plan    1. Afib with RVR: -Spontaneously converted to sinus rhythm at 8:43 AM on 2/26 with brief paroxysms of A-fib/SVT noted since -Currently, in sinus tachycardia -Transition from heparin drip to full dose Lovenox for ease of dosing  -CHA2DS2-VASc at least 7 (HTN, age x 2, stroke x 2, vascular disease, sex category) -Doubtful patient would be a good candidate for long-term [place] given advanced age, frail state, and fall risk, however patient's family have previously wished to move forward with anticoagulation.  This will need to be revisited prior to discharge given the patient is at elevated risk of bleeding complications due  to her dementia, advanced age, and frail state -TSH normal -Potassium at goal   2. NSTEMI: -Unable to determine if there are anginal symptoms due to baseline dementia -Initial and peak high-sensitivity troponin 6282 with a delta troponin of 4924, trending to 7152 -Transition from heparin drip to full dose Lovenox as above -Obtain echo -Patient is not a good candidate for invasive ischemic evaluation due to advanced age, dementia with agitation, frail state, and AKI of uncertain chronicity   3. AKI: -Improving -Chronicity of underlying renal dysfunction uncertain (prior creatinine 1.0 from 2022)   Remaining abnormalities and comorbid conditions per primary service.  Patient would benefit from palliative care consultation to discuss goals of care with family.       For questions or updates, please contact [place] Please consult [email or web address] for contact info under  Cardiology/STEMI.    Signed, Christell Faith, PA-C [facility] Pager: [phone] 04/08/2022, 11:48 AM

## 2022-04-08 NOTE — Progress Notes (Signed)
[place] for Enoxaparin  Indication: chest pain/ACS and new onset afib.   No Known Allergies  Patient Measurements: Height: '5\' 2"'$  (157.5 cm) Weight: 60.2 kg (132 lb 11.5 oz) IBW/kg (Calculated) : 50.1 Heparin Dosing Weight: 49 kg  Vital Signs: Temp: 99.1 F (37.3 C) (02/27 0131) BP: 180/79 (02/27 0747) Pulse Rate: 83 (02/27 0747)  Labs: Recent Labs    04/07/22 0840 04/07/22 0946 04/07/22 1144 04/07/22 1945 04/07/22 2226 04/08/22 0028 04/08/22 0616  HGB 13.8  --   --   --   --   --  11.2*  HCT 43.2  --   --   --   --   --  34.6*  PLT 378  --   --   --   --   --  287  APTT 30  --   --   --   --   --   --   LABPROT 13.4  --   --   --   --   --   --   INR 1.0  --   --   --   --   --   --   HEPARINUNFRC  --   --   --  0.28*  --   --  0.18*  CREATININE  --  1.67*  --   --   --   --  1.11*  TROPONINIHS 6,282*  --    < > 6,701* 7,040* 7,152*  --    < > = values in this interval not displayed.     Estimated Creatinine Clearance: 28.8 mL/min (A) (by C-G formula based on SCr of 1.11 mg/dL (H)).   Medications:  No anticoagulation prior to admission  Assessment: 89 year old female presenting with altered mental status and tachycardia being admitted with ACS. PMH includes dementia and history of breast cancer. Elevated trops and had some afib with RVR. Currently in sinus rhythm.Patient is continously screaming in her room which probably is effecting her heart rate. Plan to switch heparin infusion to enoxaparin. Heparin bolus was given 2/27 0745.   Goal of Therapy:  Heparin level 0.3-0.7 units/ml Monitor platelets by anticoagulation protocol: Yes    Plan:  Stop heparin infusion and will start enoxaparin 1 mg/kg daily (CrCl < 30 ml/min). No need for anti-xa levels unless renal function starts to decline.   Oswald Hillock, PharmD, BCPS Clinical Pharmacist 04/08/2022 10:16 AM

## 2022-04-08 NOTE — Assessment & Plan Note (Signed)
Continue Rocephin Follow cultures

## 2022-04-08 NOTE — Progress Notes (Signed)
Pt arrived from ED via ED RN, Heparin and LR infusing , Room air. Pt was initially calm but once pt was in room she became combative and agitated. Pt attempted to hit, bite, kick, and scratch staff.Pt was connected to TELE and bathed. Once left alone patient did calm down and fall asleep.   0135- NT obtaining vitals which agitated pt. Pt also required cleaning for BM. Pt became combative as RN and NT began changing and cleaning patient. Pt was cleaned, linens changed and repositioned. Daughter at bedside . After changing patient and repositioning patient , patient began to calm down. Lights turned off to promote calm environment for patient.

## 2022-04-08 NOTE — Progress Notes (Signed)
Pt continuing to be combative with staff.  Also noted poor PO intake. Staff attempting to feed and administer meds to pt - pt spitting food/medications.  Unable to console pt.  Pt HR elevated at times when combative, having episodes of SVT.  MD notified.  Will continue to monitor.

## 2022-04-08 NOTE — Assessment & Plan Note (Signed)
--  Hold Cozarr due to worsening renal function --Continue home hydralazine --IV hydralazine as needed

## 2022-04-09 DIAGNOSIS — Z7189 Other specified counseling: Secondary | ICD-10-CM

## 2022-04-09 DIAGNOSIS — F0390 Unspecified dementia without behavioral disturbance: Secondary | ICD-10-CM

## 2022-04-09 DIAGNOSIS — E440 Moderate protein-calorie malnutrition: Secondary | ICD-10-CM | POA: Diagnosis not present

## 2022-04-09 DIAGNOSIS — U071 COVID-19: Secondary | ICD-10-CM | POA: Diagnosis not present

## 2022-04-09 DIAGNOSIS — I214 Non-ST elevation (NSTEMI) myocardial infarction: Secondary | ICD-10-CM | POA: Diagnosis not present

## 2022-04-09 DIAGNOSIS — Z515 Encounter for palliative care: Secondary | ICD-10-CM

## 2022-04-09 DIAGNOSIS — E43 Unspecified severe protein-calorie malnutrition: Secondary | ICD-10-CM | POA: Insufficient documentation

## 2022-04-09 LAB — CBC
HCT: 31.9 % — ABNORMAL LOW (ref 36.0–46.0)
Hemoglobin: 10.3 g/dL — ABNORMAL LOW (ref 12.0–15.0)
MCH: 29.9 pg (ref 26.0–34.0)
MCHC: 32.3 g/dL (ref 30.0–36.0)
MCV: 92.5 fL (ref 80.0–100.0)
Platelets: 258 10*3/uL (ref 150–400)
RBC: 3.45 MIL/uL — ABNORMAL LOW (ref 3.87–5.11)
RDW: 13.9 % (ref 11.5–15.5)
WBC: 6.1 10*3/uL (ref 4.0–10.5)
nRBC: 0 % (ref 0.0–0.2)

## 2022-04-09 LAB — BASIC METABOLIC PANEL
Anion gap: 10 (ref 5–15)
BUN: 30 mg/dL — ABNORMAL HIGH (ref 8–23)
CO2: 26 mmol/L (ref 22–32)
Calcium: 8.2 mg/dL — ABNORMAL LOW (ref 8.9–10.3)
Chloride: 104 mmol/L (ref 98–111)
Creatinine, Ser: 1 mg/dL (ref 0.44–1.00)
GFR, Estimated: 54 mL/min — ABNORMAL LOW (ref >60)
Glucose, Bld: 89 mg/dL (ref 70–99)
Potassium: 3.3 mmol/L — ABNORMAL LOW (ref 3.5–5.1)
Sodium: 140 mmol/L (ref 135–145)

## 2022-04-09 LAB — MAGNESIUM: Magnesium: 1.9 mg/dL (ref 1.7–2.4)

## 2022-04-09 LAB — PHOSPHORUS: Phosphorus: 3.4 mg/dL (ref 2.5–4.6)

## 2022-04-09 LAB — LACTIC ACID, PLASMA: Lactic Acid, Venous: 1.1 mmol/L (ref 0.5–1.9)

## 2022-04-09 MED ORDER — HALOPERIDOL LACTATE 5 MG/ML IJ SOLN
2.0000 mg | Freq: Four times a day (QID) | INTRAMUSCULAR | Status: DC | PRN
  Administered 2022-04-09: 2 mg via INTRAMUSCULAR
  Filled 2022-04-09: qty 1

## 2022-04-09 MED ORDER — ENOXAPARIN SODIUM 60 MG/0.6ML IJ SOSY
1.0000 mg/kg | PREFILLED_SYRINGE | Freq: Two times a day (BID) | INTRAMUSCULAR | Status: DC
  Administered 2022-04-09: 60 mg via SUBCUTANEOUS
  Filled 2022-04-09 (×2): qty 0.6

## 2022-04-09 MED ORDER — POTASSIUM CHLORIDE 10 MEQ/100ML IV SOLN
10.0000 meq | INTRAVENOUS | Status: AC
  Administered 2022-04-09 (×4): 10 meq via INTRAVENOUS
  Filled 2022-04-09 (×4): qty 100

## 2022-04-09 NOTE — Progress Notes (Addendum)
Patient has refused taking her meds, combative, Dr. Mal Misty made aware.  PIV pulled, able to recover, redressed, intact.

## 2022-04-09 NOTE — Progress Notes (Signed)
Patient offered fluids, patient is fighting and biting on the straw, pushing staff.  Patient remains combative, a threat to self and staff.
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
## 2022-04-09 NOTE — Consult Note (Signed)
Consultation Note Date: 04/09/2022   Patient Name: Sonya Bowman  DOB: May 09, 1930  MRN: [id number]  Age / Sex: 89 y.o., female  PCP: Pcp, No Referring Physician: Jennye Boroughs, MD  Reason for Consultation: Establishing goals of care   HPI/Brief Hospital Course: 89 y.o. female  with past medical history of stroke, HTN, breast cancer (s/p right mastectomy, chemotherapy and radiation therapy) and dementia admitted from [facility] memory care unit on 04/07/2022 with increased weakness and altered mental status. Tested positive for COVID about 1 week ago, no associated cough or dyspnea noted.  Since admission, problems with increased agitation and combativeness and refusing medications. Has received IM Haldol and has been placed in soft wrist restraints.  Troponin found to be elevated, evaluated by cardiology, medical management for NSTEMI recommended as she is not a candidate for cardiac catheterization due to frailty and advanced dementia with agitation. Being managed with Lovenox injections.  Palliative medicine was consulted for assisting with goals of care conversations.  Subjective:  Extensive chart review has been completed prior to meeting patient including labs, vital signs, imaging, progress notes, orders, and available advanced directive documents from current and previous encounters.  Introduced myself as a Designer, jewellery as a member of the palliative care team. Explained palliative medicine is specialized medical care for people living with serious illness. It focuses on providing relief from the symptoms and stress of a serious illness. The goal is to improve quality of life for both the patient and the family.   Visited with Ms. Schwartz earlier in the day at her bedside. Sitter present. Ms. Venters awake and alert, confused, unable to state her name or other orientation questions. Noted refusal of morning medications and refused eating  breakfast.  Called and spoke with [name]-daughter-met at bedside later this afternoon.  [name]-daughter shares she is the only living child of Ms. Kubek, she had an older sister and older brother that have both passed. [name] shares that Ms. Huntress moved into [employer] in December 2022-prior to this she was living at home independently. Prior to admission to hospital, Ms. Devey was able to ambulate short distances independently without assistance-able to walk and transfer herself back and forth to the bathroom and walked around locked memory care unit. Required assistance with other ADL's. [name] mentions a slight decline in her appetite over the last several weeks but a noted weight loss. At baseline, on most instances, Ms. Shutes able to recognize [name] and recall her name. Discussed albumin level of 2.5 being an indicator of malnutrition.  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 Acknowledges that Ms. Cadd has had a decline and is realistic in not wanting her mom to suffer.  Confirmed DNR status. At this time, [name] wishes a bit more time to allow Ms. Muzny to recover from her acute illnesses and remains hopeful of a recovery close to her baseline prior to admission. Open to discussing options such as comfort care/hospice without noticeable improvement.  [name] struggling as she is also supporting her daughter through her cancer diagnosis. Tearful during conversation-emotional support provided. Shares that faith was an important part of Ms. Tatum's life and requests chaplain services.  I discussed importance of continued conversations with family/support persons and all members of their medical team regarding overall plan of care and treatment options ensuring decisions are in alignment with patients goals of care.  All questions/concerns addressed. Emotional support provided to  patient/family/support persons. PMT will  continue to follow and support patient as needed.  Plan to meet at bedside again tomorrow afternoon to further discuss GOC.  Objective: Primary Diagnoses: Present on Admission:  NSTEMI (non-ST elevated myocardial infarction) ([place])  Stroke ([place])  HTN (hypertension)  Dementia (HCC)  Acute renal failure superimposed on stage 2 chronic kidney disease ([place])  New onset atrial fibrillation ([place])  COVID-19 virus infection  Hypernatremia  Hypothermia  Abnormal LFTs  Acute metabolic encephalopathy  Protein-calorie malnutrition, moderate (HCC)  Severe sepsis (HCC)  UTI (urinary tract infection)   Physical Exam Constitutional:      General: She is not in acute distress.    Appearance: She is ill-appearing.  Pulmonary:     Effort: Pulmonary effort is normal. No respiratory distress.  Abdominal:     General: Abdomen is flat. There is no distension.     Palpations: Abdomen is soft.     Tenderness: There is no abdominal tenderness.  Skin:    General: Skin is warm and dry.  Neurological:     Mental Status: She is alert.     Vital Signs: BP (!) 141/77 (BP Location: Right Arm)   Pulse 61   Temp 98.7 F (37.1 C) (Axillary)   Resp 18   Ht '5\' 2"'$  (1.575 m)   Wt 60.2 kg   SpO2 98%   BMI 24.27 kg/m  Pain Scale: PAINAD   Pain Score: 0-No pain  LBM: Last BM Date : 04/08/22 Baseline Weight: Weight: 49 kg Most recent weight: Weight: 60.2 kg      Assessment and Plan  SUMMARY OF RECOMMENDATIONS   DNR Daughter desires time for outcomes-hopeful for recovery but realistic in understanding and not wanting to prolong suffering Plan to meet again 2/29 at bedside for ongoing [place] discussions PMT to continue to follow for ongoing needs and support  Thank you for this consult and allowing Palliative Medicine to participate in the care of Kaely L. Wallie Char. Palliative medicine will continue to follow and assist as needed.   Time Total: 75  minutes  Time spent includes: Detailed review of medical records (labs, vital signs), medically appropriate exam (mental status, respiratory, cardiac, skin), discussed with treatment team, counseling and educating patient, family and staff, documenting clinical information, medication management and coordination of care.   Signed by: Theodoro Grist, DNP, AGNP-C Palliative Medicine    Please contact Palliative Medicine Team phone at [phone] for questions and concerns.  For individual provider: See Shea Evans

## 2022-04-09 NOTE — Progress Notes (Signed)
Progress Note  Patient Name: Sonya Bowman Date of Encounter: 04/09/2022  Primary Cardiologist: New - consult by End  Subjective   No acute overnight cardiac events. Echo was unable to be performed due to combative patient. Sitter at bedside.   Inpatient Medications    Scheduled Meds:  aspirin EC  81 mg Oral Daily   atorvastatin  40 mg Oral Daily   divalproex  125 mg Oral BID   enoxaparin (LOVENOX) injection  1 mg/kg Subcutaneous Q24H   feeding supplement  237 mL Oral BID BM   hydrALAZINE  10 mg Oral TID   metoprolol succinate  12.5 mg Oral Daily   psyllium  1 packet Oral BID   QUEtiapine  12.5 mg Oral Q lunch   QUEtiapine  25 mg Oral QHS   Continuous Infusions:  cefTRIAXone (ROCEPHIN)  IV Stopped (04/08/22 1029)   lactated ringers 75 mL/hr at 04/08/22 2103   PRN Meds: acetaminophen, acetaminophen, albuterol, dextromethorphan-guaiFENesin, hydrALAZINE, loperamide, ondansetron (ZOFRAN) IV, senna-docusate   Vital Signs    Vitals:   04/08/22 1932 04/08/22 2342 04/09/22 0410 04/09/22 0826  BP: (!) 139/98 (!) 131/90 (!) 141/51 (!) 141/77  Pulse: 84 68 63 61  Resp: '15 19 18   '$ Temp: 99 F (37.2 C) 98.9 F (37.2 C) 98.7 F (37.1 C)   TempSrc: Axillary Axillary Axillary   SpO2:   98%   Weight:      Height:        Intake/Output Summary (Last 24 hours) at 04/09/2022 0920 Last data filed at 04/09/2022 0400 Gross per 24 hour  Intake 1431.08 ml  Output --  Net 1431.08 ml    Filed Weights   04/07/22 0855 04/07/22 2226 04/08/22 0747  Weight: 49 kg 59.5 kg 60.2 kg    Telemetry    SR with PVCs/PAC with short run of SVT - Personally Reviewed  ECG    No new tracing - Personally Reviewed  Physical Exam   GEN: Somnolent, in no acute distress.   Neck: No JVD. Cardiac: Tachycardic, I/VI systolic murmur, no rubs, or gallops.  Respiratory: Poor inspiratory effort, clear to auscultation bilaterally.  GI: Soft, nontender, non-distended.   MS: No edema; No  deformity. Neuro: Somnolent.  Psych: Somnolent.  Labs    Chemistry Recent Labs  Lab 04/07/22 0946 04/08/22 0616 04/09/22 0420  NA 146* 141 140  K 4.1 3.7 3.3*  CL 107 106 104  CO2 '28 26 26  '$ GLUCOSE 131* 91 89  BUN 55* 42* 30*  CREATININE 1.67* 1.11* 1.00  CALCIUM 8.8* 8.3* 8.2*  PROT 8.0 6.8  --   ALBUMIN 3.0* 2.5*  --   AST 79* 51*  --   ALT 18 15  --   ALKPHOS 87 74  --   BILITOT 0.7 0.9  --   GFRNONAA 29* 47* 54*  ANIONGAP '11 9 10      '$ Hematology Recent Labs  Lab 04/07/22 0840 04/08/22 0616 04/09/22 0420  WBC 8.4 8.8 6.1  RBC 4.58 3.71* 3.45*  HGB 13.8 11.2* 10.3*  HCT 43.2 34.6* 31.9*  MCV 94.3 93.3 92.5  MCH 30.1 30.2 29.9  MCHC 31.9 32.4 32.3  RDW 13.9 13.9 13.9  PLT 378 287 258     Cardiac EnzymesNo results for input(s): "TROPONINI" in the last 168 hours. No results for input(s): "TROPIPOC" in the last 168 hours.   BNP Recent Labs  Lab 04/07/22 0904  BNP 908.4*      DDimer  Recent  Labs  Lab 04/07/22 0840  DDIMER 1.36*      Radiology    CT ABDOMEN PELVIS WO CONTRAST  Result Date: 04/07/2022 IMPRESSION: 1. Constipation with findings suggestive of fecal impaction and developing stercoral colitis. No bowel obstruction. 2. Nonobstructing bilateral renal calculi. No hydronephrosis. 3. Cholelithiasis. 4. Calcified uterine fibroids. 5.  Aortic Atherosclerosis (ICD10-I70.0). Electronically Signed   By: Anner Crete M.D.   On: 04/07/2022 19:13   DG Tibia/Fibula Left  Result Date: 04/07/2022 IMPRESSION: 1. Unremarkable left tibia and fibula. Electronically Signed   By: Randa Ngo M.D.   On: 04/07/2022 18:50   US Venous Img Lower Bilateral (DVT)  Result Date: 04/07/2022 FINDINGS: VENOUS Normal compressibility of the common femoral, superficial femoral, and popliteal veins, as well as the visualized calf veins. Visualized portions of profunda femoral vein and great saphenous vein unremarkable. No filling defects to suggest DVT on  grayscale or color Doppler imaging. Doppler waveforms show normal direction of venous flow, normal respiratory plasticity and response to augmentation. Limited views of the contralateral common femoral vein are unremarkable. OTHER None. Limitations: Patient positioning. Patient has dementia and had difficulty cooperating as per the sonographer Electronically Signed   By: Jill Side M.D.   On: 04/07/2022 17:49   NM Pulmonary Perfusion  Result Date: 04/07/2022 IMPRESSION: Pulmonary embolism absent. Heterogeneous radiotracer distribution likely corresponds to sequela of recent COVID infection. Electronically Signed   By: Darrin Nipper M.D.   On: 04/07/2022 14:53   CT HEAD WO CONTRAST (5MM)  Result Date: 04/07/2022 IMPRESSION: 1. No acute CT finding. Multiple old infarctions as outlined above. 2. Widespread inflammatory opacification of the paranasal sinuses. Electronically Signed   By: Nelson Chimes M.D.   On: 04/07/2022 14:48   DG Chest Port 1 View  Result Date: 04/07/2022 IMPRESSION: No active disease. Aortic Atherosclerosis (ICD10-I70.0). Electronically Signed   By: Marijo Conception M.D.   On: 04/07/2022 08:56    Cardiac Studies   2D echo pending  Patient Profile     89 y.o. female with history of CVA with cerebrovascular disease, dementia, breast cancer status post mastectomy and chemoradiation, HTN, and recent COVID illness who is being seen today for the evaluation of Afib at the request of Dr. Blaine Hamper.   Assessment & Plan    1. Afib with RVR: -Spontaneously converted to sinus rhythm at 8:43 AM on 2/26 with brief paroxysms of A-fib/SVT noted since, burden improving -Currently, in sinus rhythm -Heparin gtt was changed to full dose Lovenox yesterday, for ease of dosing -CHA2DS2-VASc at least 7 (HTN, age x 2, stroke x 2, vascular disease, sex category) -Doubtful patient would be a good candidate for long-term [facility] given refusal for oral medications, advanced age, frail state, and fall risk,  however patient's family have previously wished to move forward with anticoagulation.  This will need to be revisited prior to discharge given the patient is at elevated risk of bleeding complications due to her dementia, advanced age, refusal to take medications, and frail state   2. NSTEMI: -Unable to determine if there are anginal symptoms due to baseline dementia -Initial and peak high-sensitivity troponin 6282 with a delta troponin of 4924, trending to 7152 -Transitioned from heparin drip to full dose Lovenox yesterday to minimize blood draws, she will complete 48 hours this afternoon  -Echo has not been able to be performed due to agitation and has been cancelled as this is unlikely to change recommendations -Patient is not a good candidate for invasive ischemic  evaluation due to advanced age, dementia with agitation, frail state, and AKI of uncertain chronicity   3. AKI: -Improving -Chronicity of underlying renal dysfunction uncertain (prior creatinine 1.0 from 2022)   Remaining abnormalities and comorbid conditions per primary service.  Patient would benefit from palliative care consultation to discuss goals of care with family.       For questions or updates, please contact [place] Please consult [email or web address] for contact info under Cardiology/STEMI.    Signed, Christell Faith, PA-C [place] Pager: [phone] 04/09/2022, 9:20 AM

## 2022-04-10 DIAGNOSIS — I4891 Unspecified atrial fibrillation: Secondary | ICD-10-CM | POA: Diagnosis not present

## 2022-04-10 DIAGNOSIS — N170 Acute kidney failure with tubular necrosis: Secondary | ICD-10-CM

## 2022-04-10 DIAGNOSIS — Z7189 Other specified counseling: Secondary | ICD-10-CM | POA: Diagnosis not present

## 2022-04-10 DIAGNOSIS — G9341 Metabolic encephalopathy: Secondary | ICD-10-CM | POA: Diagnosis not present

## 2022-04-10 DIAGNOSIS — I214 Non-ST elevation (NSTEMI) myocardial infarction: Secondary | ICD-10-CM | POA: Diagnosis not present

## 2022-04-10 DIAGNOSIS — E43 Unspecified severe protein-calorie malnutrition: Secondary | ICD-10-CM | POA: Diagnosis not present

## 2022-04-10 DIAGNOSIS — F0390 Unspecified dementia without behavioral disturbance: Secondary | ICD-10-CM | POA: Diagnosis not present

## 2022-04-10 LAB — COMPREHENSIVE METABOLIC PANEL
ALT: 16 U/L (ref 0–44)
AST: 37 U/L (ref 15–41)
Albumin: 2.2 g/dL — ABNORMAL LOW (ref 3.5–5.0)
Alkaline Phosphatase: 69 U/L (ref 38–126)
Anion gap: 9 (ref 5–15)
BUN: 22 mg/dL (ref 8–23)
CO2: 26 mmol/L (ref 22–32)
Calcium: 8.1 mg/dL — ABNORMAL LOW (ref 8.9–10.3)
Chloride: 105 mmol/L (ref 98–111)
Creatinine, Ser: 0.86 mg/dL (ref 0.44–1.00)
GFR, Estimated: >60 mL/min (ref >60)
Glucose, Bld: 89 mg/dL (ref 70–99)
Potassium: 4.1 mmol/L (ref 3.5–5.1)
Sodium: 140 mmol/L (ref 135–145)
Total Bilirubin: 0.4 mg/dL (ref 0.3–1.2)
Total Protein: 6 g/dL — ABNORMAL LOW (ref 6.5–8.1)

## 2022-04-10 MED ORDER — POLYVINYL ALCOHOL 1.4 % OP SOLN: 1.0000 [drp] | Freq: Four times a day (QID) | OPHTHALMIC | Status: DC | PRN

## 2022-04-10 MED ORDER — GLYCOPYRROLATE 1 MG PO TABS: 1.0000 mg | ORAL_TABLET | ORAL | Status: DC | PRN

## 2022-04-10 MED ORDER — ACETAMINOPHEN 650 MG RE SUPP: 650.0000 mg | Freq: Four times a day (QID) | RECTAL | Status: DC | PRN

## 2022-04-10 MED ORDER — ONDANSETRON 4 MG PO TBDP: 4.0000 mg | ORAL_TABLET | Freq: Four times a day (QID) | ORAL | Status: DC | PRN

## 2022-04-10 MED ORDER — MORPHINE SULFATE (PF) 2 MG/ML IV SOLN: 1.0000 mg | INTRAVENOUS | Status: DC | PRN

## 2022-04-10 MED ORDER — POTASSIUM CHLORIDE CRYS ER 20 MEQ PO TBCR
40.0000 meq | EXTENDED_RELEASE_TABLET | Freq: Once | ORAL | Status: AC
  Administered 2022-04-10: 40 meq via ORAL
  Filled 2022-04-10: qty 2

## 2022-04-10 MED ORDER — BIOTENE DRY MOUTH MT LIQD: 15.0000 mL | OROMUCOSAL | Status: DC | PRN

## 2022-04-10 MED ORDER — ACETAMINOPHEN 325 MG PO TABS
650.0000 mg | ORAL_TABLET | Freq: Four times a day (QID) | ORAL | Status: DC | PRN
  Administered 2022-04-19: 650 mg via ORAL
  Filled 2022-04-10: qty 2

## 2022-04-10 MED ORDER — GLYCOPYRROLATE 0.2 MG/ML IJ SOLN: 0.2000 mg | INTRAMUSCULAR | Status: DC | PRN

## 2022-04-10 MED ORDER — DIAZEPAM 5 MG/ML IJ SOLN
2.5000 mg | INTRAMUSCULAR | Status: DC | PRN
  Administered 2022-04-10 – 2022-04-12 (×3): 2.5 mg via INTRAVENOUS
  Filled 2022-04-10 (×3): qty 2

## 2022-04-10 MED ORDER — ONDANSETRON HCL 4 MG/2ML IJ SOLN: 4.0000 mg | Freq: Four times a day (QID) | INTRAMUSCULAR | Status: DC | PRN

## 2022-04-10 NOTE — Progress Notes (Signed)
  Interdisciplinary Goals of Care Family Meeting   Date carried out: 04/10/2022  Location of the meeting: Phone conference  Member's involved: Physician and Family Member or next of kin  Durable Power of Attorney or acting medical decision maker: Ms. [name], daughter.  Discussion: We discussed goals of care for Sonya Bowman .  Diagnoses, prognosis and goals of care were discussed.  Code status:   Code Status: DNR   Disposition: Possible discharge to hospice house  Time spent for the meeting: 10 minutes    Jennye Boroughs, MD  04/10/2022, 2:51 PM
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
## 2022-04-10 NOTE — Progress Notes (Signed)
       CROSS COVER NOTE  NAME: Sonya Bowman MRN: [id number] DOB : 18-Nov-1930    HPI/Events of Note   Pt needs a sitter please. Can we get a reorder for a sitter? thanks Safety: Combative, agitated, pull lines, etc   Assessment and  Interventions   Assessment:Agitation  Plan: Safety sitter renewed X X

## 2022-04-10 NOTE — TOC Progression Note (Signed)
Transition of Care [facility]) - Progression Note    Patient Details  Name: Sonya Bowman MRN: [id number] Date of Birth: [birth year]  [facility] of Care [facility] CM/SW Contact  Laurena Slimmer, RN Phone Number: 04/10/2022, 11:29 AM  Clinical Narrative:    Damaris Schooner with Kennyth Lose at [facility] to give an update on patient and to advise patient would be returning.         Expected Discharge Plan and Services                                               Social Determinants of Health (SDOH) Interventions SDOH Screenings   Food Insecurity: Unknown (04/07/2022)  Housing: Low Risk  (04/07/2022)  Transportation Needs: Unknown (04/07/2022)  Utilities: Unknown (04/07/2022)  Tobacco Use: High Risk (04/08/2022)    Readmission Risk Interventions     No data to display

## 2022-04-10 NOTE — Progress Notes (Signed)
Daily Progress Note   Patient Name: Sonya Bowman       Date: 04/10/2022 DOB: 11/15/1930  Age: 89 y.o. MRN#: [id number] Attending Physician: Jennye Boroughs, MD Primary Care Physician: Pcp, No Admit Date: 04/07/2022  Reason for Consultation/Follow-up: Establishing goals of care  HPI/Brief Hospital Review:  89 y.o. female  with past medical history of stroke, HTN, breast cancer (s/p right mastectomy, chemotherapy and radiation therapy) and dementia admitted from [facility] memory care unit on 04/07/2022 with increased weakness and altered mental status. Tested positive for COVID about 1 week ago, no associated cough or dyspnea noted.   Since admission, problems with increased agitation and combativeness and refusing medications. Has received IM Haldol and has been placed in soft wrist restraints.   Troponin found to be elevated, evaluated by cardiology, medical management for NSTEMI recommended as she is not a candidate for cardiac catheterization due to frailty and advanced dementia with agitation. Being managed with Lovenox injections.   Palliative medicine was consulted for assisting with goals of care conversations.  Subjective: Extensive chart review has been completed prior to meeting patient including labs, vital signs, imaging, progress notes, orders, and available advanced directive documents from current and previous encounters.    Visited with [name] at her bedside. Awake and alert, remains confused. Ongoing periods of agitation and combativeness, remains in soft wrist restraints and sitter at bedside.  [name]-daughter at bedside. Share she just finished up a conversation with Dr. Zandra Abts updates provided and prognosis. [name] tearful as she shares her  understanding of her moms progressed dementia and frailty. Understanding of NSTEMI diagnosis and [name] not being a candidate for cardiac catheterization.  [name] shares she wishes to proceed with comfort care/hospice services. Agrees to meeting with hospice liaison-Hospice home versus returning to [facility] under hospice services. Agrees to transition to comfort care while [name] remains in [place] voices understanding that [name] would no longer receive aggressive medical interventions such as continuous vital signs, lab work, radiology testing, or medications not focused on comfort. All care would focus on how the patient is looking and feeling. This would include management of any symptoms that may cause discomfort, pain, shortness of breath, cough, nausea, agitation, anxiety, and/or secretions etc. Symptoms would be managed with medications and other non-pharmacological interventions such as  spiritual support if requested, repositioning, music therapy, or therapeutic listening. Family verbalized understanding and appreciation.    Orders placed to transition to comfort care.  Emotional support provided to [place]. All questions and concerns were addressed.  Objective:  Physical Exam Constitutional:      General: She is not in acute distress.    Appearance: She is ill-appearing.  Pulmonary:     Effort: Pulmonary effort is normal. No respiratory distress.  Skin:    General: Skin is warm and dry.  Neurological:     Mental Status: She is alert.             Vital Signs: BP (!) 157/132 (BP Location: Right Arm)   Pulse 64   Temp 98 F (36.7 C)   Resp 16   Ht '5\' 2"'$  (1.575 m)   Wt 60.2 kg   SpO2 90%   BMI 24.27 kg/m  SpO2: SpO2: 90 % O2 Device: O2 Device: Room Air O2 Flow Rate:     Palliative Care Assessment & Plan   Assessment/Recommendation/Plan  DNR Transition to full comfort measures Family agrees to meeting with hospice liaison to  discuss options IV Morphine PRN, IV Robinul PRN, IV diazepam PRN, IM Haldol PRN to maintain comfort Recommend continuing 1:1 sitter for safety, recommend removing soft wrist restraints to promote comfort PMT to continue to follow for ongoing needs and support   Care plan was discussed with nursing staff and primary team.  Thank you for allowing the Palliative Medicine Team to assist in the care of this patient.  Total time:  35 minutes  Time spent includes: Detailed review of medical records (labs, vital signs), medically appropriate exam (mental status, respiratory, cardiac, skin), discussed with treatment team, counseling and educating patient, family and staff, documenting clinical information, medication management and coordination of care.  Theodoro Grist, DNP, AGNP-C Palliative Medicine   Please contact Palliative Medicine Team phone at [phone] for questions and concerns.

## 2022-04-10 NOTE — Progress Notes (Signed)
Rounding Note    Patient Name: Sonya Bowman Date of Encounter: 04/10/2022  [place] Cardiologist: new to [facility]  Subjective   Lives at [facility] memory unit History of dementia Presenting with weakness, altered mental status Recently tested positive for COVID  Discussed with nurses, combative, agitated, pulling lines overnight, mittens placed for safety Refusing medications, refusing select meals Has received IM Haldol this admission  Seen yesterday by palliative care Malnutrition and issue, as well as dementia, functional decline  Inpatient Medications    Scheduled Meds:  aspirin EC  81 mg Oral Daily   atorvastatin  40 mg Oral Daily   divalproex  125 mg Oral BID   enoxaparin (LOVENOX) injection  1 mg/kg Subcutaneous Q12H   feeding supplement  237 mL Oral BID BM   hydrALAZINE  10 mg Oral TID   metoprolol succinate  12.5 mg Oral Daily   psyllium  1 packet Oral BID   QUEtiapine  12.5 mg Oral Q lunch   QUEtiapine  25 mg Oral QHS   Continuous Infusions:  PRN Meds: acetaminophen, acetaminophen, albuterol, dextromethorphan-guaiFENesin, haloperidol lactate, hydrALAZINE, loperamide, ondansetron (ZOFRAN) IV, senna-docusate   Vital Signs    Vitals:   04/09/22 2343 04/10/22 0350 04/10/22 0824 04/10/22 1126  BP: 134/66 128/64 (!) 157/132   Pulse: 62 61 (!) 51 64  Resp: '18 18 16   '$ Temp: 98.3 F (36.8 C) 98.4 F (36.9 C) 98 F (36.7 C)   TempSrc: Axillary Axillary    SpO2: 99% 100% 90%   Weight:      Height:        Intake/Output Summary (Last 24 hours) at 04/10/2022 1408 Last data filed at 04/10/2022 1239 Gross per 24 hour  Intake 1852.78 ml  Output --  Net 1852.78 ml      04/08/2022    7:47 AM 04/07/2022   10:26 PM 04/07/2022    8:55 AM  Last 3 Weights  Weight (lbs) 132 lb 11.5 oz 131 lb 2.8 oz 108 lb 0.4 oz  Weight (kg) 60.2 kg 59.5 kg 49 kg      Telemetry     - Personally Reviewed  ECG    - Personally Reviewed  Physical Exam    GEN: No acute distress.  Thin Neck: No JVD Cardiac: RRR, no murmurs, rubs, or gallops.  Respiratory: Clear to auscultation bilaterally. GI: Soft, nontender, non-distended, thin MS: No edema; No deformity. Neuro:  Nonfocal  Psych: Mild confusion  Labs    High Sensitivity Troponin:   Recent Labs  Lab 04/07/22 1144 04/07/22 1945 04/07/22 2226 04/08/22 0028 04/08/22 1233  TROPONINIHS 4,924* 6,701* 7,040* 7,152* 3,761*     Chemistry Recent Labs  Lab 04/07/22 0946 04/08/22 0616 04/09/22 0420 04/10/22 1013  NA 146* 141 140 140  K 4.1 3.7 3.3* 4.1  CL 107 106 104 105  CO2 '28 26 26 26  '$ GLUCOSE 131* 91 89 89  BUN 55* 42* 30* 22  CREATININE 1.67* 1.11* 1.00 0.86  CALCIUM 8.8* 8.3* 8.2* 8.1*  MG  --   --  1.9  --   PROT 8.0 6.8  --  6.0*  ALBUMIN 3.0* 2.5*  --  2.2*  AST 79* 51*  --  37  ALT 18 15  --  16  ALKPHOS 87 74  --  69  BILITOT 0.7 0.9  --  0.4  GFRNONAA 29* 47* 54* >60  ANIONGAP '11 9 10 9    '$ Lipids  Recent Labs  Lab 04/08/22 0616  CHOL 181  TRIG 78  HDL 44  LDLCALC 121*  CHOLHDL 4.1    Hematology Recent Labs  Lab 04/07/22 0840 04/08/22 0616 04/09/22 0420  WBC 8.4 8.8 6.1  RBC 4.58 3.71* 3.45*  HGB 13.8 11.2* 10.3*  HCT 43.2 34.6* 31.9*  MCV 94.3 93.3 92.5  MCH 30.1 30.2 29.9  MCHC 31.9 32.4 32.3  RDW 13.9 13.9 13.9  PLT 378 287 258   Thyroid  Recent Labs  Lab 04/07/22 0840  TSH 2.703    BNP Recent Labs  Lab 04/07/22 0904  BNP 908.4*    DDimer  Recent Labs  Lab 04/07/22 0840  DDIMER 1.36*     Radiology    No results found.  Cardiac Studies   Unable to obtain echo  Patient Profile     89 y.o. female with history of CVA with cerebrovascular disease, dementia, breast cancer status post mastectomy and chemoradiation, HTN, and recent COVID illness who is being seen today for the evaluation of Afib   Assessment & Plan    . Afib with RVR: This admission with paroxysmal atrial fibrillation  converted to sinus rhythm  at 8:43 AM on 2/26  Brief paroxysms of atrial fibrillation/SVT noted Currently maintaining normal sinus rhythm -Heparin infusion changed to Lovenox yesterday -CHA2DS2-VASc at least 7 (HTN, age x 2, stroke x 2, vascular disease, sex category) -Agree with notes from yesterday indicating she would be a poor candidate for long-term anticoagulation for various reasons including refusal for oral medications, advanced age, frail state, and fall risk,   2. NSTEMI:  baseline dementia, poor historian -Initial and peak high-sensitivity troponin 6282 with a delta troponin of 4924, trending to 7152 -Completed heparin infusion, now on Lovenox -No symptoms this morning concerning for angina -Poor candidate for ischemic workup, Unable to complete echocardiogram this admission secondary to agitation  3. AKI: -Renal function normalized, likely secondary to dehydration  4.  Malnutrition Albumin 2.2 Refusing select meals, requires full assist for eating  5.  Dementia/agitation On Depakote, Haldol as needed, Seroquel  Disposition Agree with palliative care assessment, comfort measures  [facility] will sign off.   Medication Recommendations: No additional medication changes Other recommendations (labs, testing, etc): No further testing needed Follow up as an outpatient: Follow-up as needed   Total encounter time more than 50 minutes  Greater than 50% was spent in counseling and coordination of care with the patient   For questions or updates, please contact [place] Please consult [email or web address] for contact info under        Signed, Ida Rogue, MD  04/10/2022, 2:08 PM

## 2022-04-11 DIAGNOSIS — E43 Unspecified severe protein-calorie malnutrition: Secondary | ICD-10-CM | POA: Diagnosis not present

## 2022-04-11 DIAGNOSIS — R54 Age-related physical debility: Secondary | ICD-10-CM | POA: Diagnosis present

## 2022-04-11 DIAGNOSIS — I214 Non-ST elevation (NSTEMI) myocardial infarction: Secondary | ICD-10-CM | POA: Diagnosis not present

## 2022-04-11 DIAGNOSIS — R627 Adult failure to thrive: Secondary | ICD-10-CM | POA: Diagnosis present

## 2022-04-11 DIAGNOSIS — Z7189 Other specified counseling: Secondary | ICD-10-CM | POA: Diagnosis not present

## 2022-04-11 DIAGNOSIS — F0390 Unspecified dementia without behavioral disturbance: Secondary | ICD-10-CM | POA: Diagnosis not present

## 2022-04-11 NOTE — Progress Notes (Signed)
Nutrition Brief Note  Chart reviewed. Pt now transitioning to comfort care.  No further nutrition interventions planned at this time.  Please re-consult as needed.   Helmut Hennon W, RD, LDN, CDCES Registered Dietitian II Certified Diabetes Care and Education Specialist Please refer to AMION for RD and/or RD on-call/weekend/after hours pager   

## 2022-04-11 NOTE — Care Management Important Message (Signed)
Important Message  Patient Details  Name: BASYA GRUENBERG MRN: [id number] Date of Birth: November 30, 1930   Medicare Important Message Given:  Other (see comment)  Patient is on comfort care and plans to discharge with hospice. Out of respect for the patient and family No Important Message  from [facility] given.   Juliann Pulse A Jakory Matsuo 04/11/2022, 8:54 AM

## 2022-04-11 NOTE — Progress Notes (Addendum)
Progress Note    Sonya Bowman  [id number] DOB: 1930/12/08  DOA: 04/07/2022 PCP: Pcp, No      Brief Narrative:    Medical records reviewed and are as summarized below:  Sonya Bowman is a 89 y.o. female with medical history significant of stroke, HTN, dementia, CKD-2, breast cancer (s/p of right mastectomy, chemo and radiation therapy), positive Covid 19 test at 7 days ago, who presented on 04/07/2022 for evaluation of generalized weakness and altered mental status.  Per her daughter at bedside on admission, pt tested positive for COVID about 1 week ago.  She had cough, no apparent dyspnea.  Increased confusion from baseline, can typically recognize family and oriented to self, not always oriented to place or time.  Day of admission, pt unable to recognize her daughter.  Pt unable to provide history or symptoms due to dementia.   Due to severe agitation initially, and she received 1 dose of Versed in the ED, and was then calm and sleeping.   2/27 -- ongoing issues with combativeness, pt hitting and biting nurses, spitting at them, refuses oral medications.  Soft restraints had to be initiated for safety.  Seen by cardiology.  Not a candidate for cath due to agitation and advanced dementia, frailty.  Medical management of her NSTEMI is limited by refusing medications.  IV heparin was changed to full dose Lovenox to reduce need for frequent blood draws.      Assessment/Plan:   Principal Problem:   NSTEMI (non-ST elevated myocardial infarction) ([place]) Active Problems:   Atrial fibrillation, rapid ([place])   Stroke ([place])   HTN (hypertension)   Acute renal failure superimposed on stage 2 chronic kidney disease ([place])   COVID-19 virus infection   Severe sepsis (HCC)   UTI (urinary tract infection)   Hypernatremia   Hypothermia   Acute metabolic encephalopathy   Abnormal LFTs   Dementia (HCC)   Protein-calorie malnutrition, severe   Failure to thrive in adult    Frailty   Nutrition Problem: Severe Malnutrition Etiology: social / environmental circumstances (advanced dementia)  Signs/Symptoms: severe fat depletion, severe muscle depletion   Body mass index is 24.27 kg/m.   Acute NSTEMI: Troponin was up to 7,152.  Not a candidate for cardiac cath  Paroxysmal atrial fibrillation, history of stroke   Acute metabolic encephalopathy, dementia with behavioral disturbance:    AKI on CKD stage II, hypernatremia   Severe sepsis secondary to acute UTI: Completed 3 days of IV ceftriaxone on 04/09/2022   Fecal impaction, stercoral colitis, constipation:    Elevated liver enzymes, hypokalemia   History of recent COVID-19 infection: Outside the window for antiviral therapy.  Poor oral intake, frailty, failure to thrive, frailty   PLAN  Continue comfort measures.  Follow-up with hospice team to assist with disposition.     Diet Order             DIET - DYS 1 Room service appropriate? No; Fluid consistency: Thin  Diet effective now                            Consultants: Cardiologist  Procedures: None    Medications:    divalproex  125 mg Oral BID   QUEtiapine  12.5 mg Oral Q lunch   QUEtiapine  25 mg Oral QHS   Continuous Infusions:     Anti-infectives (From admission, onward)    Start     Dose/Rate  Route Frequency Ordered Stop   04/08/22 1000  cefTRIAXone (ROCEPHIN) 2 g in sodium chloride 0.9 % 100 mL IVPB  Status:  Discontinued        2 g 200 mL/hr over 30 Minutes Intravenous Every 24 hours 04/07/22 1739 04/09/22 1325   04/07/22 1045  cefTRIAXone (ROCEPHIN) 2 g in sodium chloride 0.9 % 100 mL IVPB        2 g 200 mL/hr over 30 Minutes Intravenous  Once 04/07/22 1039 04/07/22 1217              Family Communication/Anticipated D/C date and plan/Code Status   DVT prophylaxis:      Code Status: DNR  Family Communication: None Disposition Plan: Plan to discharge to hospice  home   Status is: Inpatient Remains inpatient appropriate because: Comfort measures       Subjective:   Interval events noted.  She is sedated/sleepy and unable to provide any history.  She has a sitter at the bedside  Objective:    Vitals:   04/10/22 0350 04/10/22 0824 04/10/22 1126 04/10/22 2318  BP: 128/64 (!) 157/132  (!) 159/42  Pulse: 61 (!) 51 64 (!) 59  Resp: '18 16  18  '$ Temp: 98.4 F (36.9 C) 98 F (36.7 C)  97.8 F (36.6 C)  TempSrc: Axillary   Axillary  SpO2: 100% 90%  90%  Weight:      Height:       No data found.  No intake or output data in the 24 hours ending 04/11/22 1618  Filed Weights   04/07/22 0855 04/07/22 2226 04/08/22 0747  Weight: 49 kg 59.5 kg 60.2 kg    Exam:  GEN: NAD SKIN: Warm and dry EYES: No acute abnormality ENT: MMM CV: RRR PULM: CTA B ABD: soft, ND, NT, +BS CNS: Sedated EXT: No edema or tenderness     Data Reviewed:   I have personally reviewed following labs and imaging studies:  Labs: Labs show the following:   Basic Metabolic Panel: Recent Labs  Lab 04/07/22 0946 04/08/22 0616 04/09/22 0420 04/10/22 1013  NA 146* 141 140 140  K 4.1 3.7 3.3* 4.1  CL 107 106 104 105  CO2 '28 26 26 26  '$ GLUCOSE 131* 91 89 89  BUN 55* 42* 30* 22  CREATININE 1.67* 1.11* 1.00 0.86  CALCIUM 8.8* 8.3* 8.2* 8.1*  MG  --   --  1.9  --   PHOS  --   --  3.4  --    GFR Estimated Creatinine Clearance: 37.1 mL/min (by C-G formula based on SCr of 0.86 mg/dL). Liver Function Tests: Recent Labs  Lab 04/07/22 0946 04/08/22 0616 04/10/22 1013  AST 79* 51* 37  ALT '18 15 16  '$ ALKPHOS 87 74 69  BILITOT 0.7 0.9 0.4  PROT 8.0 6.8 6.0*  ALBUMIN 3.0* 2.5* 2.2*   No results for input(s): "LIPASE", "AMYLASE" in the last 168 hours. No results for input(s): "AMMONIA" in the last 168 hours. Coagulation profile Recent Labs  Lab 04/07/22 0840  INR 1.0    CBC: Recent Labs  Lab 04/07/22 0840 04/08/22 0616 04/09/22 0420  WBC  8.4 8.8 6.1  NEUTROABS 6.3  --   --   HGB 13.8 11.2* 10.3*  HCT 43.2 34.6* 31.9*  MCV 94.3 93.3 92.5  PLT 378 287 258   Cardiac Enzymes: No results for input(s): "CKTOTAL", "CKMB", "CKMBINDEX", "TROPONINI" in the last 168 hours. BNP (last 3 results) No results for input(s): "PROBNP"  in the last 8760 hours. CBG: No results for input(s): "GLUCAP" in the last 168 hours. D-Dimer: No results for input(s): "DDIMER" in the last 72 hours.  Hgb A1c: No results for input(s): "HGBA1C" in the last 72 hours.  Lipid Profile: No results for input(s): "CHOL", "HDL", "LDLCALC", "TRIG", "CHOLHDL", "LDLDIRECT" in the last 72 hours.  Thyroid function studies: No results for input(s): "TSH", "T4TOTAL", "T3FREE", "THYROIDAB" in the last 72 hours.  Invalid input(s): "FREET3"  Anemia work up: No results for input(s): "VITAMINB12", "FOLATE", "FERRITIN", "TIBC", "IRON", "RETICCTPCT" in the last 72 hours. Sepsis Labs: Recent Labs  Lab 04/07/22 0840 04/07/22 0904 04/07/22 1144 04/08/22 0616 04/08/22 1233 04/09/22 0420  PROCALCITON  --   --  2.63  --  1.00  --   WBC 8.4  --   --  8.8  --  6.1  LATICACIDVEN  --  3.8* 6.1*  --  2.5* 1.1    Microbiology Recent Results (from the past 240 hour(s))  Blood Culture (routine x 2)     Status: None (Preliminary result)   Collection Time: 04/07/22  8:42 AM   Specimen: BLOOD  Result Value Ref Range Status   Specimen Description BLOOD LEFT FA  Final   Special Requests   Final    BOTTLES DRAWN AEROBIC AND ANAEROBIC Blood Culture results may not be optimal due to an inadequate volume of blood received in culture bottles   Culture   Final    NO GROWTH 4 DAYS Performed at [facility], [address], [place], [place] [address]    Report Status PENDING  Incomplete  Resp panel by RT-PCR (RSV, Flu A&B, Covid) Anterior Nasal Swab     Status: Abnormal   Collection Time: 04/07/22  8:55 AM   Specimen: Anterior Nasal Swab  Result Value Ref Range  Status   SARS Coronavirus 2 by RT PCR POSITIVE (A) NEGATIVE Final    Comment: (NOTE) SARS-CoV-2 target nucleic acids are DETECTED.  The SARS-CoV-2 RNA is generally detectable in upper respiratory specimens during the acute phase of infection. Positive results are indicative of the presence of the identified virus, but do not rule out bacterial infection or co-infection with other pathogens not detected by the test. Clinical correlation with patient history and other diagnostic information is necessary to determine patient infection status. The expected result is Negative.  Fact Sheet for Patients: EntrepreneurPulse.com.au  Fact Sheet for Healthcare Providers: IncredibleEmployment.be  This test is not yet approved or cleared by the [place] FDA and  has been authorized for detection and/or diagnosis of SARS-CoV-2 by FDA under an Emergency Use Authorization (EUA).  This EUA will remain in effect (meaning this test can be used) for the duration of  the COVID-19 declaration under Section 564(b)(1) of the A ct, 21 U.S.C. section 360bbb-3(b)(1), unless the authorization is terminated or revoked sooner.     Influenza A by PCR NEGATIVE NEGATIVE Final   Influenza B by PCR NEGATIVE NEGATIVE Final    Comment: (NOTE) The Xpert Xpress SARS-CoV-2/FLU/RSV plus assay is intended as an aid in the diagnosis of influenza from Nasopharyngeal swab specimens and should not be used as a sole basis for treatment. Nasal washings and aspirates are unacceptable for Xpert Xpress SARS-CoV-2/FLU/RSV testing.  Fact Sheet for Patients: EntrepreneurPulse.com.au  Fact Sheet for Healthcare Providers: IncredibleEmployment.be  This test is not yet approved or cleared by the [place] FDA and has been authorized for detection and/or diagnosis of SARS-CoV-2 by FDA under an Emergency Use  Authorization (EUA). This EUA will remain in  effect (meaning this test can be used) for the duration of the COVID-19 declaration under Section 564(b)(1) of the Act, 21 U.S.C. section 360bbb-3(b)(1), unless the authorization is terminated or revoked.     Resp Syncytial Virus by PCR NEGATIVE NEGATIVE Final    Comment: (NOTE) Fact Sheet for Patients: EntrepreneurPulse.com.au  Fact Sheet for Healthcare Providers: IncredibleEmployment.be  This test is not yet approved or cleared by the [place] FDA and has been authorized for detection and/or diagnosis of SARS-CoV-2 by FDA under an Emergency Use Authorization (EUA). This EUA will remain in effect (meaning this test can be used) for the duration of the COVID-19 declaration under Section 564(b)(1) of the Act, 21 U.S.C. section 360bbb-3(b)(1), unless the authorization is terminated or revoked.  Performed at [facility], [address] [place], [place] [address]   Blood Culture (routine x 2)     Status: None (Preliminary result)   Collection Time: 04/07/22  9:04 AM   Specimen: BLOOD  Result Value Ref Range Status   Specimen Description BLOOD RIGTH [facility]  Final   Special Requests   Final    BOTTLES DRAWN AEROBIC AND ANAEROBIC Blood Culture results may not be optimal due to an inadequate volume of blood received in culture bottles   Culture   Final    NO GROWTH 4 DAYS Performed at [facility], [address], [place],  [address]    Report Status PENDING  Incomplete    Procedures and diagnostic studies:  No results found.             LOS: 4 days   Cielo Arias  [facility]   Pager on [email or web address]. If 7PM-7AM, please contact night-coverage at [email or web address]     04/11/2022, 4:18 PM

## 2022-04-11 NOTE — TOC Progression Note (Addendum)
Transition of Care [facility]) - Progression Note    Patient Details  Name: Sonya Bowman MRN: [id number] Date of Birth: 10-Jun-1930  [facility] CM/SW Contact  Laurena Slimmer, RN Phone Number: 04/11/2022, 11:09 AM  Clinical Narrative:    Patient transitioned to comfort care Spoke with patient's daughter [name] regarding hospice and her preference. She would like to proceed with hospice services via [facility].   Referral sent to [facility] from [facility].         Expected Discharge Plan and Services                                               Social Determinants of Health (SDOH) Interventions SDOH Screenings   Food Insecurity: Unknown (04/07/2022)  Housing: Low Risk  (04/07/2022)  Transportation Needs: Unknown (04/07/2022)  Utilities: Unknown (04/07/2022)  Tobacco Use: High Risk (04/08/2022)    Readmission Risk Interventions     No data to display

## 2022-04-11 NOTE — Progress Notes (Signed)
[facility])  Hospital Liaison RN note  Received request from [facility] for hospice services at [facility] after discharge. Chart and patient information under review by Hospice physician.   Spoke with daughter [name] to initiate education related to hospice philosophy, services, and team approach to care. Patient/family verbalized understanding of information given. Per discussion, the plan is for discharge is undecided.   No immediate DME needs  Please send signed and completed DNR with patient/family. Please provide prescriptions at discharge as needed for ongoing symptom management.   Please call with any hospice related questions or concerns.   Thank you for the opportunity to participate in this patient's care.  Jhonnie Garner, Therapist, sports, BSN [employer] [phone]

## 2022-04-11 NOTE — Plan of Care (Signed)
  Problem: Education: Goal: Knowledge of General Education information will improve Description: Including pain rating scale, medication(s)/side effects and non-pharmacologic comfort measures Outcome: Progressing   Problem: Health Behavior/Discharge Planning: Goal: Ability to manage health-related needs will improve Outcome: Progressing   Problem: Clinical Measurements: Goal: Ability to maintain clinical measurements within normal limits will improve Outcome: Progressing Goal: Will remain free from infection Outcome: Progressing Goal: Diagnostic test results will improve Outcome: Progressing Goal: Respiratory complications will improve Outcome: Progressing Goal: Cardiovascular complication will be avoided Outcome: Progressing   Problem: Activity: Goal: Risk for activity intolerance will decrease Outcome: Progressing   Problem: Nutrition: Goal: Adequate nutrition will be maintained Outcome: Progressing   Problem: Coping: Goal: Level of anxiety will decrease Outcome: Progressing   Problem: Elimination: Goal: Will not experience complications related to bowel motility Outcome: Progressing Goal: Will not experience complications related to urinary retention Outcome: Progressing   Problem: Pain Managment: Goal: General experience of comfort will improve Outcome: Progressing   Problem: Safety: Goal: Ability to remain free from injury will improve Outcome: Progressing   Problem: Skin Integrity: Goal: Risk for impaired skin integrity will decrease Outcome: Progressing   Problem: Safety: Goal: Non-violent Restraint(s) Outcome: Progressing   Problem: Education: Goal: Knowledge of the prescribed therapeutic regimen will improve Outcome: Progressing   Problem: Coping: Goal: Ability to identify and develop effective coping behavior will improve Outcome: Progressing   Problem: Clinical Measurements: Goal: Quality of life will improve Outcome: Progressing    Problem: Respiratory: Goal: Verbalizations of increased ease of respirations will increase Outcome: Progressing   Problem: Role Relationship: Goal: Family's ability to cope with current situation will improve Outcome: Progressing Goal: Ability to verbalize concerns, feelings, and thoughts to partner or family member will improve Outcome: Progressing   Problem: Pain Management: Goal: Satisfaction with pain management regimen will improve Outcome: Progressing

## 2022-04-11 NOTE — Progress Notes (Signed)
Daily Progress Note   Patient Name: Sonya Bowman       Date: 04/11/2022 DOB: May 29, 1930  Age: 89 y.o. MRN#: [id number] Attending Physician: Jennye Boroughs, MD Primary Care Physician: Pcp, No Admit Date: 04/07/2022  Reason for Consultation/Follow-up: Establishing goals of care  HPI/Brief Hospital Review: 89 y.o. female  with past medical history of stroke, HTN, breast cancer (s/p right mastectomy, chemotherapy and radiation therapy) and dementia admitted from [facility] memory care unit on 04/07/2022 with increased weakness and altered mental status. Tested positive for COVID about 1 week ago, no associated cough or dyspnea noted.   Since admission, problems with increased agitation and combativeness and refusing medications. Has received IM Haldol and has been placed in soft wrist restraints.   Troponin found to be elevated, evaluated by cardiology, medical management for NSTEMI recommended as she is not a candidate for cardiac catheterization due to frailty and advanced dementia with agitation. Being managed with Lovenox injections.  After discussions with Sonya Bowman-daughter 2/29, decision was made to transition to full comfort measures, interested in pursuing hospice services   Palliative medicine was consulted for assisting with goals of care conversations.  Subjective: Extensive chart review has been completed prior to meeting patient including labs, vital signs, imaging, progress notes, orders, and available advanced directive documents from current and previous encounters.    Visited with [name] at her bedside. Sitter present as well as Engineer, civil (consulting). Granddaughter also present during time of visit. Nursing staff assisting with changing and repositioning [name]. [name]  expresses agitation with hygiene and repositioning but is able to be consoled by granddaughter.  TOC coordinating hospice liaison conversations with Sonya Bowman-daughter.   Noted further decline in albumin at 2.2 with ongoing fluctuations in PO intake. Single does of diazepam administered overnight-adequate response to medication.  Called and spoke with Sonya Bowman-addressed all questions and concerns, awaiting call from hospice liaison.  PMT available for ongoing needs and support. Explained I will be unavailable until Tuesday, anticipate discharge before that time but will touch base on return to service if appropriate.  Objective:  Physical Exam Constitutional:      General: She is not in acute distress.    Appearance: She is ill-appearing.  Pulmonary:     Effort: Pulmonary effort is normal. No respiratory distress.  Skin:  General: Skin is warm and dry.  Neurological:     Mental Status: She is alert.             Vital Signs: BP (!) 159/42 (BP Location: Right Arm)   Pulse (!) 59   Temp 97.8 F (36.6 C) (Axillary)   Resp 18   Ht '5\' 2"'$  (1.575 m)   Wt 60.2 kg   SpO2 90%   BMI 24.27 kg/m  SpO2: SpO2: 90 % O2 Device: O2 Device: Room Air O2 Flow Rate:     Palliative Care Assessment & Plan   Assessment/Recommendation/Plan  DNR Remains Full Comfort Measures Family awaiting contact with hospice for further disposition plan Continue morphine PRN, diazepam PRN, Haldol PRN to maintain comfort  Thank you for allowing the Palliative Medicine Team to assist in the care of this patient.  Total time:  25 minutes  Time spent includes: Detailed review of medical records (labs, imaging, vital signs), medically appropriate exam (mental status, respiratory, cardiac, skin), discussed with treatment team, counseling and educating patient, family and staff, documenting clinical information, medication management and coordination of care.  Theodoro Grist, DNP, AGNP-C Palliative  Medicine   Please contact Palliative Medicine Team phone at [phone] for questions and concerns.

## 2022-04-12 DIAGNOSIS — R627 Adult failure to thrive: Secondary | ICD-10-CM | POA: Diagnosis not present

## 2022-04-12 DIAGNOSIS — I214 Non-ST elevation (NSTEMI) myocardial infarction: Secondary | ICD-10-CM | POA: Diagnosis not present

## 2022-04-12 LAB — CULTURE, BLOOD (ROUTINE X 2)
Culture: NO GROWTH
Culture: NO GROWTH

## 2022-04-12 NOTE — Plan of Care (Signed)
  Problem: Education: Goal: Knowledge of General Education information will improve Description: Including pain rating scale, medication(s)/side effects and non-pharmacologic comfort measures Outcome: Progressing   Problem: Health Behavior/Discharge Planning: Goal: Ability to manage health-related needs will improve Outcome: Progressing   Problem: Clinical Measurements: Goal: Ability to maintain clinical measurements within normal limits will improve Outcome: Progressing Goal: Will remain free from infection Outcome: Progressing Goal: Diagnostic test results will improve Outcome: Progressing Goal: Respiratory complications will improve Outcome: Progressing Goal: Cardiovascular complication will be avoided Outcome: Progressing   Problem: Activity: Goal: Risk for activity intolerance will decrease Outcome: Progressing   Problem: Nutrition: Goal: Adequate nutrition will be maintained Outcome: Progressing   Problem: Coping: Goal: Level of anxiety will decrease Outcome: Progressing   Problem: Elimination: Goal: Will not experience complications related to bowel motility Outcome: Progressing Goal: Will not experience complications related to urinary retention Outcome: Progressing   Problem: Pain Managment: Goal: General experience of comfort will improve Outcome: Progressing   Problem: Safety: Goal: Ability to remain free from injury will improve Outcome: Progressing   Problem: Skin Integrity: Goal: Risk for impaired skin integrity will decrease Outcome: Progressing   Problem: Safety: Goal: Non-violent Restraint(s) Outcome: Progressing   Problem: Education: Goal: Knowledge of the prescribed therapeutic regimen will improve Outcome: Progressing   Problem: Coping: Goal: Ability to identify and develop effective coping behavior will improve Outcome: Progressing   Problem: Clinical Measurements: Goal: Quality of life will improve Outcome: Progressing    Problem: Respiratory: Goal: Verbalizations of increased ease of respirations will increase Outcome: Progressing   Problem: Role Relationship: Goal: Family's ability to cope with current situation will improve Outcome: Progressing Goal: Ability to verbalize concerns, feelings, and thoughts to partner or family member will improve Outcome: Progressing   Problem: Pain Management: Goal: Satisfaction with pain management regimen will improve Outcome: Progressing
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
## 2022-04-13 DIAGNOSIS — R627 Adult failure to thrive: Secondary | ICD-10-CM | POA: Diagnosis not present

## 2022-04-13 DIAGNOSIS — I214 Non-ST elevation (NSTEMI) myocardial infarction: Secondary | ICD-10-CM | POA: Diagnosis not present

## 2022-04-13 NOTE — Progress Notes (Signed)
Progress Note    Sonya Bowman  [id number] DOB: [birth year]  DOA: 04/07/2022 PCP: Pcp, No      Brief Narrative:    Medical records reviewed and are as summarized below:  Sonya Bowman is a 89 y.o. female with medical history significant of stroke, HTN, dementia, CKD-2, breast cancer (s/p of right mastectomy, chemo and radiation therapy), positive Covid 19 test at 7 days ago, who presented on 04/07/2022 for evaluation of generalized weakness and altered mental status.  Per her daughter at bedside on admission, Sonya Bowman tested positive for COVID about 1 week ago.  She had cough, no apparent dyspnea.  Increased confusion from baseline, can typically recognize family and oriented to self, not always oriented to place or time.  Day of admission, Sonya Bowman unable to recognize her daughter.  Sonya Bowman unable to provide history or symptoms due to dementia.   Due to severe agitation initially, and she received 1 dose of Versed in the ED, and was then calm and sleeping.   2/27 -- ongoing issues with combativeness, Sonya Bowman hitting and biting nurses, spitting at them, refuses oral medications.  Soft restraints had to be initiated for safety.  Seen by cardiology.  Not a candidate for cath due to agitation and advanced dementia, frailty.  Medical management of her NSTEMI is limited by refusing medications.  IV heparin was changed to full dose Lovenox to reduce need for frequent blood draws.      Assessment/Plan:   Principal Problem:   NSTEMI (non-ST elevated myocardial infarction) ([place]) Active Problems:   Atrial fibrillation, rapid ([place])   Stroke ([place])   HTN (hypertension)   Acute renal failure superimposed on stage 2 chronic kidney disease ([place])   COVID-19 virus infection   Severe sepsis (HCC)   UTI (urinary tract infection)   Hypernatremia   Hypothermia   Acute metabolic encephalopathy   Abnormal LFTs   Dementia (HCC)   Protein-calorie malnutrition, severe   Failure to thrive in adult    Frailty   Nutrition Problem: Severe Malnutrition Etiology: social / environmental circumstances (advanced dementia)  Signs/Symptoms: severe fat depletion, severe muscle depletion   Body mass index is 24.27 kg/m.   Acute NSTEMI: Troponin was up to 7,152.  Not a candidate for cardiac cath  Paroxysmal atrial fibrillation, history of stroke   Acute metabolic encephalopathy, dementia with behavioral disturbance:    AKI on CKD stage II, hypernatremia   Severe sepsis secondary to acute UTI: Completed 3 days of IV ceftriaxone on 04/09/2022   Fecal impaction, stercoral colitis, constipation:    Elevated liver enzymes, hypokalemia   History of recent COVID-19 infection: Outside the window for antiviral therapy.  Poor oral intake, frailty, failure to thrive, frailty   PLAN  She remains on comfort measures.  Follow-up with hospice team to assist with disposition.    Diet Order             DIET - DYS 1 Room service appropriate? No; Fluid consistency: Thin  Diet effective now                            Consultants: Cardiologist  Procedures: None    Medications:    divalproex  125 mg Oral BID   QUEtiapine  12.5 mg Oral Q lunch   QUEtiapine  25 mg Oral QHS   Continuous Infusions:     Anti-infectives (From admission, onward)    Start  Dose/Rate Route Frequency Ordered Stop   04/08/22 1000  cefTRIAXone (ROCEPHIN) 2 g in sodium chloride 0.9 % 100 mL IVPB  Status:  Discontinued        2 g 200 mL/hr over 30 Minutes Intravenous Every 24 hours 04/07/22 1739 04/09/22 1325   04/07/22 1045  cefTRIAXone (ROCEPHIN) 2 g in sodium chloride 0.9 % 100 mL IVPB        2 g 200 mL/hr over 30 Minutes Intravenous  Once 04/07/22 1039 04/07/22 1217              Family Communication/Anticipated D/C date and plan/Code Status   DVT prophylaxis:      Code Status: DNR  Family Communication: None Disposition Plan: Plan to discharge to hospice  home   Status is: Inpatient Remains inpatient appropriate because: Comfort measures       Subjective:   Interval events noted.  She is sedated and unable to provide any history  Objective:    Vitals:   04/10/22 2318 04/12/22 1231 04/12/22 2217 04/13/22 0900  BP: (!) 159/42 (!) 136/58 131/68 (!) 164/69  Pulse: (!) 59  94 61  Resp: 18  16   Temp: 97.8 F (36.6 C)  98.9 F (37.2 C) (!) 96.9 F (36.1 C)  TempSrc: Axillary  Oral Axillary  SpO2: 90%  97% 100%  Weight:      Height:       No data found.   Intake/Output Summary (Last 24 hours) at 04/13/2022 1547 Last data filed at 04/13/2022 0800 Gross per 24 hour  Intake --  Output 625 ml  Net -625 ml    Filed Weights   04/07/22 0855 04/07/22 2226 04/08/22 0747  Weight: 49 kg 59.5 kg 60.2 kg    Exam:  GEN: NAD, looks comfortable SKIN: Warm and dry EYES: No pallor or icterus ENT: MMM CV: RRR PULM: CTA B ABD: soft, ND, +BS CNS: sedated EXT: No edema     Data Reviewed:   I have personally reviewed following labs and imaging studies:  Labs: Labs show the following:   Basic Metabolic Panel: Recent Labs  Lab 04/07/22 0946 04/08/22 0616 04/09/22 0420 04/10/22 1013  NA 146* 141 140 140  K 4.1 3.7 3.3* 4.1  CL 107 106 104 105  CO2 '28 26 26 26  '$ GLUCOSE 131* 91 89 89  BUN 55* 42* 30* 22  CREATININE 1.67* 1.11* 1.00 0.86  CALCIUM 8.8* 8.3* 8.2* 8.1*  MG  --   --  1.9  --   PHOS  --   --  3.4  --    GFR Estimated Creatinine Clearance: 37.1 mL/min (by C-G formula based on SCr of 0.86 mg/dL). Liver Function Tests: Recent Labs  Lab 04/07/22 0946 04/08/22 0616 04/10/22 1013  AST 79* 51* 37  ALT '18 15 16  '$ ALKPHOS 87 74 69  BILITOT 0.7 0.9 0.4  PROT 8.0 6.8 6.0*  ALBUMIN 3.0* 2.5* 2.2*   No results for input(s): "LIPASE", "AMYLASE" in the last 168 hours. No results for input(s): "AMMONIA" in the last 168 hours. Coagulation profile Recent Labs  Lab 04/07/22 0840  INR 1.0    CBC: Recent  Labs  Lab 04/07/22 0840 04/08/22 0616 04/09/22 0420  WBC 8.4 8.8 6.1  NEUTROABS 6.3  --   --   HGB 13.8 11.2* 10.3*  HCT 43.2 34.6* 31.9*  MCV 94.3 93.3 92.5  PLT 378 287 258   Cardiac Enzymes: No results for input(s): "CKTOTAL", "CKMB", "CKMBINDEX", "TROPONINI"  in the last 168 hours. BNP (last 3 results) No results for input(s): "PROBNP" in the last 8760 hours. CBG: No results for input(s): "GLUCAP" in the last 168 hours. D-Dimer: No results for input(s): "DDIMER" in the last 72 hours.  Hgb A1c: No results for input(s): "HGBA1C" in the last 72 hours.  Lipid Profile: No results for input(s): "CHOL", "HDL", "LDLCALC", "TRIG", "CHOLHDL", "LDLDIRECT" in the last 72 hours.  Thyroid function studies: No results for input(s): "TSH", "T4TOTAL", "T3FREE", "THYROIDAB" in the last 72 hours.  Invalid input(s): "FREET3"  Anemia work up: No results for input(s): "VITAMINB12", "FOLATE", "FERRITIN", "TIBC", "IRON", "RETICCTPCT" in the last 72 hours. Sepsis Labs: Recent Labs  Lab 04/07/22 0840 04/07/22 0904 04/07/22 1144 04/08/22 0616 04/08/22 1233 04/09/22 0420  PROCALCITON  --   --  2.63  --  1.00  --   WBC 8.4  --   --  8.8  --  6.1  LATICACIDVEN  --  3.8* 6.1*  --  2.5* 1.1    Microbiology Recent Results (from the past 240 hour(s))  Blood Culture (routine x 2)     Status: None   Collection Time: 04/07/22  8:42 AM   Specimen: BLOOD  Result Value Ref Range Status   Specimen Description BLOOD LEFT FA  Final   Special Requests   Final    BOTTLES DRAWN AEROBIC AND ANAEROBIC Blood Culture results may not be optimal due to an inadequate volume of blood received in culture bottles   Culture   Final    NO GROWTH 5 DAYS Performed at [facility], [address] [place], [place] [address]    Report Status 04/12/2022 FINAL  Final  Resp panel by RT-PCR (RSV, Flu A&B, Covid) Anterior Nasal Swab     Status: Abnormal   Collection Time: 04/07/22  8:55 AM   Specimen:  Anterior Nasal Swab  Result Value Ref Range Status   SARS Coronavirus 2 by RT PCR POSITIVE (A) NEGATIVE Final    Comment: (NOTE) SARS-CoV-2 target nucleic acids are DETECTED.  The SARS-CoV-2 RNA is generally detectable in upper respiratory specimens during the acute phase of infection. Positive results are indicative of the presence of the identified virus, but do not rule out bacterial infection or co-infection with other pathogens not detected by the test. Clinical correlation with patient history and other diagnostic information is necessary to determine patient infection status. The expected result is Negative.  Fact Sheet for Patients: EntrepreneurPulse.com.au  Fact Sheet for Healthcare Providers: IncredibleEmployment.be  This test is not yet approved or cleared by the [place] FDA and  has been authorized for detection and/or diagnosis of SARS-CoV-2 by FDA under an Emergency Use Authorization (EUA).  This EUA will remain in effect (meaning this test can be used) for the duration of  the COVID-19 declaration under Section 564(b)(1) of the A ct, 21 U.S.C. section 360bbb-3(b)(1), unless the authorization is terminated or revoked sooner.     Influenza A by PCR NEGATIVE NEGATIVE Final   Influenza B by PCR NEGATIVE NEGATIVE Final    Comment: (NOTE) The Xpert Xpress SARS-CoV-2/FLU/RSV plus assay is intended as an aid in the diagnosis of influenza from Nasopharyngeal swab specimens and should not be used as a sole basis for treatment. Nasal washings and aspirates are unacceptable for Xpert Xpress SARS-CoV-2/FLU/RSV testing.  Fact Sheet for Patients: EntrepreneurPulse.com.au  Fact Sheet for Healthcare Providers: IncredibleEmployment.be  This test is not yet approved or cleared by the [place] FDA and has been  authorized for detection and/or diagnosis of SARS-CoV-2 by FDA under an Emergency Use  Authorization (EUA). This EUA will remain in effect (meaning this test can be used) for the duration of the COVID-19 declaration under Section 564(b)(1) of the Act, 21 U.S.C. section 360bbb-3(b)(1), unless the authorization is terminated or revoked.     Resp Syncytial Virus by PCR NEGATIVE NEGATIVE Final    Comment: (NOTE) Fact Sheet for Patients: EntrepreneurPulse.com.au  Fact Sheet for Healthcare Providers: IncredibleEmployment.be  This test is not yet approved or cleared by the [place] FDA and has been authorized for detection and/or diagnosis of SARS-CoV-2 by FDA under an Emergency Use Authorization (EUA). This EUA will remain in effect (meaning this test can be used) for the duration of the COVID-19 declaration under Section 564(b)(1) of the Act, 21 U.S.C. section 360bbb-3(b)(1), unless the authorization is terminated or revoked.  Performed at [facility], [address] [place], [place] [address]   Blood Culture (routine x 2)     Status: None   Collection Time: 04/07/22  9:04 AM   Specimen: BLOOD  Result Value Ref Range Status   Specimen Description BLOOD RIGTH [facility]  Final   Special Requests   Final    BOTTLES DRAWN AEROBIC AND ANAEROBIC Blood Culture results may not be optimal due to an inadequate volume of blood received in culture bottles   Culture   Final    NO GROWTH 5 DAYS Performed at [facility], [address], [place],  [address]    Report Status 04/12/2022 FINAL  Final    Procedures and diagnostic studies:  No results found.             LOS: 6 days   Harel Repetto  [facility]   Pager on [email or web address]. If 7PM-7AM, please contact night-coverage at [email or web address]     04/13/2022, 3:47 PM

## 2022-04-14 DIAGNOSIS — I214 Non-ST elevation (NSTEMI) myocardial infarction: Secondary | ICD-10-CM | POA: Diagnosis not present

## 2022-04-14 DIAGNOSIS — R627 Adult failure to thrive: Secondary | ICD-10-CM | POA: Diagnosis not present

## 2022-04-14 NOTE — Care Management Important Message (Signed)
Important Message  Patient Details  Name: Sonya Bowman MRN: [id number] Date of Birth: 07/05/1930   Medicare Important Message Given:  Yes     Loann Quill 04/14/2022, 3:47 PM

## 2022-04-14 NOTE — Progress Notes (Signed)
[facility]) Hospice hospital liaison note  Patient referred for hospice services upon return to [facility].   Liaison continues to follow for discharge planning.   Thank you for the opportunity to participate in this patient's care Jhonnie Garner, BSN, RN [facility] hospital liaison [phone]

## 2022-04-14 NOTE — TOC Progression Note (Signed)
Transition of Care [facility]) - Progression Note    Patient Details  Name: Sonya Bowman MRN: [id number] Date of Birth: 12-03-1930  Transition of Care [facility]) CM/SW Contact  Beverly Sessions, RN Phone Number: 04/14/2022, 12:37 PM  Clinical Narrative:     Confirmed with daughter and Misty with [employer] that plan is for return back to [facility] with hospice.  Spoke with Colletta Maryland at [employer].  Clinical faxed to [phone].  She states that Catalina Lunger at [facility] will have to call me back directly to schedule an assessment for the patient        Expected Discharge Plan and Services                                               Social Determinants of Health (SDOH) Interventions SDOH Screenings   Food Insecurity: Unknown (04/07/2022)  Housing: Low Risk  (04/07/2022)  Transportation Needs: Unknown (04/07/2022)  Utilities: Unknown (04/07/2022)  Tobacco Use: High Risk (04/08/2022)    Readmission Risk Interventions     No data to display

## 2022-04-14 NOTE — Progress Notes (Signed)
Progress Note    Sonya Bowman  [id number] DOB: [birth year]  DOA: 04/07/2022 PCP: Pcp, No      Brief Narrative:    Medical records reviewed and are as summarized below:  Sonya Bowman is a 89 y.o. female with medical history significant of stroke, HTN, dementia, CKD-2, breast cancer (s/p of right mastectomy, chemo and radiation therapy), positive Covid 19 test at 7 days ago, who presented on 04/07/2022 for evaluation of generalized weakness and altered mental status.  Per her daughter at bedside on admission, pt tested positive for COVID about 1 week ago.  She had cough, no apparent dyspnea.  Increased confusion from baseline, can typically recognize family and oriented to self, not always oriented to place or time.  Day of admission, pt unable to recognize her daughter.  Pt unable to provide history or symptoms due to dementia.   Due to severe agitation initially, and she received 1 dose of Versed in the ED, and was then calm and sleeping.   2/27 -- ongoing issues with combativeness, pt hitting and biting nurses, spitting at them, refuses oral medications.  Soft restraints had to be initiated for safety.  Seen by cardiology.  Not a candidate for cath due to agitation and advanced dementia, frailty.  Medical management of her NSTEMI is limited by refusing medications.  IV heparin was changed to full dose Lovenox to reduce need for frequent blood draws.      Assessment/Plan:   Principal Problem:   NSTEMI (non-ST elevated myocardial infarction) ([place]) Active Problems:   Atrial fibrillation, rapid ([place])   Stroke ([place])   HTN (hypertension)   Acute renal failure superimposed on stage 2 chronic kidney disease ([place])   COVID-19 virus infection   Severe sepsis (HCC)   UTI (urinary tract infection)   Hypernatremia   Hypothermia   Acute metabolic encephalopathy   Abnormal LFTs   Dementia (HCC)   Protein-calorie malnutrition, severe   Failure to thrive in adult    Frailty   Nutrition Problem: Severe Malnutrition Etiology: social / environmental circumstances (advanced dementia)  Signs/Symptoms: severe fat depletion, severe muscle depletion   Body mass index is 24.27 kg/m.   Acute NSTEMI: Troponin was up to 7,152.  Not a candidate for cardiac cath  Paroxysmal atrial fibrillation, history of stroke   Acute metabolic encephalopathy, dementia with behavioral disturbance:    AKI on CKD stage II, hypernatremia   Severe sepsis secondary to acute UTI: Completed 3 days of IV ceftriaxone on 04/09/2022   Fecal impaction, stercoral colitis, constipation:    Elevated liver enzymes, hypokalemia   History of recent COVID-19 infection: Outside the window for antiviral therapy.  Poor oral intake, frailty, failure to thrive, frailty   PLAN  She remains on comfort measures.  Follow-up with hospice team to assist with disposition.    Diet Order             DIET - DYS 1 Room service appropriate? No; Fluid consistency: Thin  Diet effective now                            Consultants: Cardiologist  Procedures: None    Medications:    divalproex  125 mg Oral BID   QUEtiapine  12.5 mg Oral Q lunch   QUEtiapine  25 mg Oral QHS   Continuous Infusions:     Anti-infectives (From admission, onward)    Start  Dose/Rate Route Frequency Ordered Stop   04/08/22 1000  cefTRIAXone (ROCEPHIN) 2 g in sodium chloride 0.9 % 100 mL IVPB  Status:  Discontinued        2 g 200 mL/hr over 30 Minutes Intravenous Every 24 hours 04/07/22 1739 04/09/22 1325   04/07/22 1045  cefTRIAXone (ROCEPHIN) 2 g in sodium chloride 0.9 % 100 mL IVPB        2 g 200 mL/hr over 30 Minutes Intravenous  Once 04/07/22 1039 04/07/22 1217              Family Communication/Anticipated D/C date and plan/Code Status   DVT prophylaxis:      Code Status: DNR  Family Communication: None Disposition Plan: Plan to discharge to nursing home  with hospice   Status is: Inpatient Remains inpatient appropriate because: Awaiting placement       Subjective:   Interval events noted.  Patient was being fed by the nurse assistant at the bedside. She does not engage in any conversation.  She is confused and cannot provide any history.  Objective:    Vitals:   04/12/22 1231 04/12/22 2217 04/13/22 0900 04/14/22 0838  BP: (!) 136/58 131/68 (!) 164/69 (!) 152/96  Pulse:  94 61 74  Resp:  16  18  Temp:  98.9 F (37.2 C) (!) 96.9 F (36.1 C) 98 F (36.7 C)  TempSrc:  Oral Axillary Oral  SpO2:  97% 100% 100%  Weight:      Height:       No data found.   Intake/Output Summary (Last 24 hours) at 04/14/2022 1514 Last data filed at 04/14/2022 0700 Gross per 24 hour  Intake 350 ml  Output 400 ml  Net -50 ml    Filed Weights   04/07/22 0855 04/07/22 2226 04/08/22 0747  Weight: 49 kg 59.5 kg 60.2 kg    Exam:  GEN: NAD SKIN: Warm and dry EYES: EOMI ENT: MMM CV: RRR PULM: CTA B ABD: soft, ND, NT, +BS CNS: Alert, disoriented, non focal EXT: No edema or tenderness    Data Reviewed:   I have personally reviewed following labs and imaging studies:  Labs: Labs show the following:   Basic Metabolic Panel: Recent Labs  Lab 04/08/22 0616 04/09/22 0420 04/10/22 1013  NA 141 140 140  K 3.7 3.3* 4.1  CL 106 104 105  CO2 '26 26 26  '$ GLUCOSE 91 89 89  BUN 42* 30* 22  CREATININE 1.11* 1.00 0.86  CALCIUM 8.3* 8.2* 8.1*  MG  --  1.9  --   PHOS  --  3.4  --    GFR Estimated Creatinine Clearance: 37.1 mL/min (by C-G formula based on SCr of 0.86 mg/dL). Liver Function Tests: Recent Labs  Lab 04/08/22 0616 04/10/22 1013  AST 51* 37  ALT 15 16  ALKPHOS 74 69  BILITOT 0.9 0.4  PROT 6.8 6.0*  ALBUMIN 2.5* 2.2*   No results for input(s): "LIPASE", "AMYLASE" in the last 168 hours. No results for input(s): "AMMONIA" in the last 168 hours. Coagulation profile No results for input(s): "INR", "PROTIME" in the  last 168 hours.   CBC: Recent Labs  Lab 04/08/22 0616 04/09/22 0420  WBC 8.8 6.1  HGB 11.2* 10.3*  HCT 34.6* 31.9*  MCV 93.3 92.5  PLT 287 258   Cardiac Enzymes: No results for input(s): "CKTOTAL", "CKMB", "CKMBINDEX", "TROPONINI" in the last 168 hours. BNP (last 3 results) No results for input(s): "PROBNP" in the last 8760 hours.  CBG: No results for input(s): "GLUCAP" in the last 168 hours. D-Dimer: No results for input(s): "DDIMER" in the last 72 hours.  Hgb A1c: No results for input(s): "HGBA1C" in the last 72 hours.  Lipid Profile: No results for input(s): "CHOL", "HDL", "LDLCALC", "TRIG", "CHOLHDL", "LDLDIRECT" in the last 72 hours.  Thyroid function studies: No results for input(s): "TSH", "T4TOTAL", "T3FREE", "THYROIDAB" in the last 72 hours.  Invalid input(s): "FREET3"  Anemia work up: No results for input(s): "VITAMINB12", "FOLATE", "FERRITIN", "TIBC", "IRON", "RETICCTPCT" in the last 72 hours. Sepsis Labs: Recent Labs  Lab 04/08/22 0616 04/08/22 1233 04/09/22 0420  PROCALCITON  --  1.00  --   WBC 8.8  --  6.1  LATICACIDVEN  --  2.5* 1.1    Microbiology Recent Results (from the past 240 hour(s))  Blood Culture (routine x 2)     Status: None   Collection Time: 04/07/22  8:42 AM   Specimen: BLOOD  Result Value Ref Range Status   Specimen Description BLOOD LEFT FA  Final   Special Requests   Final    BOTTLES DRAWN AEROBIC AND ANAEROBIC Blood Culture results may not be optimal due to an inadequate volume of blood received in culture bottles   Culture   Final    NO GROWTH 5 DAYS Performed at [facility], [address] [place], [place] [address]    Report Status 04/12/2022 FINAL  Final  Resp panel by RT-PCR (RSV, Flu A&B, Covid) Anterior Nasal Swab     Status: Abnormal   Collection Time: 04/07/22  8:55 AM   Specimen: Anterior Nasal Swab  Result Value Ref Range Status   SARS Coronavirus 2 by RT PCR POSITIVE (A) NEGATIVE Final     Comment: (NOTE) SARS-CoV-2 target nucleic acids are DETECTED.  The SARS-CoV-2 RNA is generally detectable in upper respiratory specimens during the acute phase of infection. Positive results are indicative of the presence of the identified virus, but do not rule out bacterial infection or co-infection with other pathogens not detected by the test. Clinical correlation with patient history and other diagnostic information is necessary to determine patient infection status. The expected result is Negative.  Fact Sheet for Patients: EntrepreneurPulse.com.au  Fact Sheet for Healthcare Providers: IncredibleEmployment.be  This test is not yet approved or cleared by the [place] FDA and  has been authorized for detection and/or diagnosis of SARS-CoV-2 by FDA under an Emergency Use Authorization (EUA).  This EUA will remain in effect (meaning this test can be used) for the duration of  the COVID-19 declaration under Section 564(b)(1) of the A ct, 21 U.S.C. section 360bbb-3(b)(1), unless the authorization is terminated or revoked sooner.     Influenza A by PCR NEGATIVE NEGATIVE Final   Influenza B by PCR NEGATIVE NEGATIVE Final    Comment: (NOTE) The Xpert Xpress SARS-CoV-2/FLU/RSV plus assay is intended as an aid in the diagnosis of influenza from Nasopharyngeal swab specimens and should not be used as a sole basis for treatment. Nasal washings and aspirates are unacceptable for Xpert Xpress SARS-CoV-2/FLU/RSV testing.  Fact Sheet for Patients: EntrepreneurPulse.com.au  Fact Sheet for Healthcare Providers: IncredibleEmployment.be  This test is not yet approved or cleared by the [place] FDA and has been authorized for detection and/or diagnosis of SARS-CoV-2 by FDA under an Emergency Use Authorization (EUA). This EUA will remain in effect (meaning this test can be used) for the duration of  the COVID-19 declaration under Section 564(b)(1) of the Act, 21 U.S.C. section  360bbb-3(b)(1), unless the authorization is terminated or revoked.     Resp Syncytial Virus by PCR NEGATIVE NEGATIVE Final    Comment: (NOTE) Fact Sheet for Patients: EntrepreneurPulse.com.au  Fact Sheet for Healthcare Providers: IncredibleEmployment.be  This test is not yet approved or cleared by the [place] FDA and has been authorized for detection and/or diagnosis of SARS-CoV-2 by FDA under an Emergency Use Authorization (EUA). This EUA will remain in effect (meaning this test can be used) for the duration of the COVID-19 declaration under Section 564(b)(1) of the Act, 21 U.S.C. section 360bbb-3(b)(1), unless the authorization is terminated or revoked.  Performed at [facility], [address] [place], [place] [address]   Blood Culture (routine x 2)     Status: None   Collection Time: 04/07/22  9:04 AM   Specimen: BLOOD  Result Value Ref Range Status   Specimen Description BLOOD RIGTH [facility]  Final   Special Requests   Final    BOTTLES DRAWN AEROBIC AND ANAEROBIC Blood Culture results may not be optimal due to an inadequate volume of blood received in culture bottles   Culture   Final    NO GROWTH 5 DAYS Performed at [facility], [address] [place], [place] [address]    Report Status 04/12/2022 FINAL  Final    Procedures and diagnostic studies:  No results found.             LOS: 7 days   Annice Jolly  [facility]   Pager on [email or web address]. If 7PM-7AM, please contact night-coverage at [email or web address]     04/14/2022, 3:14 PM

## 2022-04-14 NOTE — Plan of Care (Signed)
  Problem: Education: Goal: Knowledge of General Education information will improve Description: Including pain rating scale, medication(s)/side effects and non-pharmacologic comfort measures Outcome: Progressing   Problem: Health Behavior/Discharge Planning: Goal: Ability to manage health-related needs will improve Outcome: Progressing   Problem: Clinical Measurements: Goal: Ability to maintain clinical measurements within normal limits will improve Outcome: Progressing Goal: Will remain free from infection Outcome: Progressing Goal: Diagnostic test results will improve Outcome: Progressing Goal: Respiratory complications will improve Outcome: Progressing Goal: Cardiovascular complication will be avoided Outcome: Progressing   Problem: Activity: Goal: Risk for activity intolerance will decrease Outcome: Progressing   Problem: Nutrition: Goal: Adequate nutrition will be maintained Outcome: Progressing   Problem: Coping: Goal: Level of anxiety will decrease Outcome: Progressing   Problem: Elimination: Goal: Will not experience complications related to bowel motility Outcome: Progressing Goal: Will not experience complications related to urinary retention Outcome: Progressing   Problem: Pain Managment: Goal: General experience of comfort will improve Outcome: Progressing   Problem: Safety: Goal: Ability to remain free from injury will improve Outcome: Progressing   Problem: Skin Integrity: Goal: Risk for impaired skin integrity will decrease Outcome: Progressing   Problem: Safety: Goal: Non-violent Restraint(s) Outcome: Progressing   Problem: Education: Goal: Knowledge of the prescribed therapeutic regimen will improve Outcome: Progressing   Problem: Coping: Goal: Ability to identify and develop effective coping behavior will improve Outcome: Progressing   Problem: Clinical Measurements: Goal: Quality of life will improve Outcome: Progressing    Problem: Respiratory: Goal: Verbalizations of increased ease of respirations will increase Outcome: Progressing   Problem: Role Relationship: Goal: Family's ability to cope with current situation will improve Outcome: Progressing Goal: Ability to verbalize concerns, feelings, and thoughts to partner or family member will improve Outcome: Progressing   Problem: Pain Management: Goal: Satisfaction with pain management regimen will improve Outcome: Progressing

## 2022-04-15 DIAGNOSIS — F0390 Unspecified dementia without behavioral disturbance: Secondary | ICD-10-CM | POA: Diagnosis not present

## 2022-04-15 DIAGNOSIS — Z515 Encounter for palliative care: Secondary | ICD-10-CM | POA: Diagnosis not present

## 2022-04-15 DIAGNOSIS — Z7189 Other specified counseling: Secondary | ICD-10-CM | POA: Diagnosis not present

## 2022-04-15 DIAGNOSIS — I214 Non-ST elevation (NSTEMI) myocardial infarction: Secondary | ICD-10-CM | POA: Diagnosis not present

## 2022-04-15 MED ORDER — CHLORHEXIDINE GLUCONATE CLOTH 2 % EX PADS: 6.0000 | MEDICATED_PAD | Freq: Every day | CUTANEOUS | Status: DC
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
## 2022-04-15 NOTE — TOC Progression Note (Signed)
Transition of Care [facility]) - Progression Note    Patient Details  Name: Sonya Bowman MRN: [id number] Date of Birth: 05-29-1930  Transition of Care [facility]) CM/SW [place], LCSW Phone Number: 04/15/2022, 12:49 PM  Clinical Narrative:   Left voicemail for Catina at [facility] to see when someone can come assess patient for return.  Expected Discharge Plan and Services                                               Social Determinants of Health (SDOH) Interventions SDOH Screenings   Food Insecurity: Unknown (04/07/2022)  Housing: Low Risk  (04/07/2022)  Transportation Needs: Unknown (04/07/2022)  Utilities: Unknown (04/07/2022)  Tobacco Use: High Risk (04/08/2022)    Readmission Risk Interventions     No data to display

## 2022-04-15 NOTE — Progress Notes (Signed)
[facility]) Hospice hospital liaison note   Patient referred for hospice services upon return to [facility].    Liaison continues to follow for discharge planning.    Thank you for the opportunity to participate in this patient's care Jhonnie Garner, BSN, RN [facility] hospital liaison [phone]

## 2022-04-15 NOTE — Progress Notes (Signed)
Daily Progress Note   Patient Name: Sonya Bowman       Date: 04/15/2022 DOB: 04-26-1930  Age: 89 y.o. MRN#: [id number] Attending Physician: Jennye Boroughs, MD Primary Care Physician: Pcp, No Admit Date: 04/07/2022  Reason for Consultation/Follow-up: Establishing goals of care  HPI/Brief Hospital Review: 89 y.o. female  with past medical history of stroke, HTN, breast cancer (s/p right mastectomy, chemotherapy and radiation therapy) and dementia admitted from [facility] memory care unit on 04/07/2022 with increased weakness and altered mental status. Tested positive for COVID about 1 week ago, no associated cough or dyspnea noted.   Since admission, problems with increased agitation and combativeness and refusing medications. Has received IM Haldol and has been placed in soft wrist restraints.   Troponin found to be elevated, evaluated by cardiology, medical management for NSTEMI recommended as she is not a candidate for cardiac catheterization due to frailty and advanced dementia with agitation. Being managed with Lovenox injections.   After discussions with [name]-daughter 2/29, decision was made to transition to full comfort measures, interested in pursuing hospice services   Palliative medicine was consulted for assisting with goals of care conversations  Subjective: Extensive chart review has been completed prior to meeting patient including labs, vital signs, imaging, progress notes, orders, and available advanced directive documents from current and previous encounters.    Visited with Sonya Bowman at her bedside. Resting but easily responds to calling of her name. Remains confused and unable to provide history. No family at bedside during time of visit.  Upon review of chart,  comfort medications not being utilized currently as Sonya Bowman appears comfortable without signs of acute distress. Awaiting return to [facility] under hospice services[facility] to assess prior to returning to facility.  Attempted to contact [name]-daughter, no answer and unable to leave VM.  PMT will follow peripherally as needs arise or for ongoing support.  Objective:  Physical Exam Constitutional:      General: She is not in acute distress.    Appearance: She is ill-appearing.  Pulmonary:     Effort: Pulmonary effort is normal. No respiratory distress.  Abdominal:     General: Abdomen is flat. There is no distension.     Palpations: Abdomen is soft.     Tenderness: There is no abdominal tenderness.  Skin:  General: Skin is warm and dry.  Neurological:     Mental Status: She is alert.     Motor: Weakness present.             Vital Signs: BP (!) 169/68 (BP Location: Left Arm)   Pulse (!) 117   Temp (!) 97.2 F (36.2 C) (Axillary)   Resp 16   Ht '5\' 2"'$  (1.575 m)   Wt 60.2 kg   SpO2 99%   BMI 24.27 kg/m  SpO2: SpO2: 99 % O2 Device: O2 Device: Room Air O2 Flow Rate:     Palliative Care Assessment & Plan   Assessment/Recommendation/Plan  DNR Comfort Measures Awaiting disposition-TOC involved PMT to follow peripherally as needs arise will reengage  Thank you for allowing the Palliative Medicine Team to assist in the care of this patient.  Total time:  25 minutes  Time spent includes: Detailed review of medical records, medically appropriate exam (mental status, respiratory, cardiac, skin), discussed with treatment team, counseling and educating patient, family and staff, documenting clinical information, medication management and coordination of care.  Sonya Grist, DNP, AGNP-C Palliative Medicine   Please contact Palliative Medicine Team phone at [phone] for questions and concerns.

## 2022-04-16 DIAGNOSIS — E43 Unspecified severe protein-calorie malnutrition: Secondary | ICD-10-CM | POA: Diagnosis not present

## 2022-04-16 DIAGNOSIS — I214 Non-ST elevation (NSTEMI) myocardial infarction: Secondary | ICD-10-CM | POA: Diagnosis not present

## 2022-04-16 DIAGNOSIS — F0390 Unspecified dementia without behavioral disturbance: Secondary | ICD-10-CM | POA: Diagnosis not present

## 2022-04-16 DIAGNOSIS — Z7189 Other specified counseling: Secondary | ICD-10-CM | POA: Diagnosis not present

## 2022-04-16 MED ORDER — ORAL CARE MOUTH RINSE: 15.0000 mL | OROMUCOSAL | Status: DC | PRN

## 2022-04-16 NOTE — TOC Progression Note (Signed)
Transition of Care [facility]) - Progression Note    Patient Details  Name: Sonya Bowman MRN: [id number] Date of Birth: 1930/12/08  Transition of Care [facility]) CM/SW [place], LCSW Phone Number: 04/16/2022, 10:15 AM  Clinical Narrative:   Damaris Schooner with Zigmund Daniel at [facility]. She will come to assess patient tomorrow morning around 8:00-8:30 and call CSW with decision.  Expected Discharge Plan and Services                                               Social Determinants of Health (SDOH) Interventions SDOH Screenings   Food Insecurity: Unknown (04/07/2022)  Housing: Low Risk  (04/07/2022)  Transportation Needs: Unknown (04/07/2022)  Utilities: Unknown (04/07/2022)  Tobacco Use: High Risk (04/08/2022)    Readmission Risk Interventions     No data to display

## 2022-04-16 NOTE — Progress Notes (Signed)
Daily Progress Note   Patient Name: Sonya Bowman       Date: 04/16/2022 DOB: [birth year]  Age: 89 y.o. MRN#: [id number] Attending Physician: Fritzi Mandes, MD Primary Care Physician: Pcp, No Admit Date: 04/07/2022  Reason for Consultation/Follow-up: Establishing goals of care  HPI/Brief Hospital Review: 89 y.o. female  with past medical history of stroke, HTN, breast cancer (s/p right mastectomy, chemotherapy and radiation therapy) and dementia admitted from [facility] memory care unit on 04/07/2022 with increased weakness and altered mental status. Tested positive for COVID about 1 week ago, no associated cough or dyspnea noted.   Since admission, problems with increased agitation and combativeness and refusing medications. Has received IM Haldol and has been placed in soft wrist restraints.   Troponin found to be elevated, evaluated by cardiology, medical management for NSTEMI recommended as she is not a candidate for cardiac catheterization due to frailty and advanced dementia with agitation. Being managed with Lovenox injections.   After discussions with [name]-daughter 2/29, decision was made to transition to full comfort measures, interested in pursuing hospice services   Palliative medicine was consulted for assisting with goals of care conversations  Subjective: Extensive chart review has been completed prior to meeting patient including labs, vital signs, imaging, progress notes, orders, and available advanced directive documents from current and previous encounters.    Visited with [name] at her bedside. Resting comfortable. Review of MAR, not utilizing medications for comfort at this time, appears comfortable on exam. PO intake continues to fluctuate. Appears less  agitated and/or combative, no nursing notes with mention of events.  Spoke with [facility] scheduled for assessment 3/7 AM, hopeful [name] will be able to return to their facility under hospice services.  Called and spoke with [name]-updated on POC. Appreciative of update, concerned about placement of [name] if she is unable to return to [facility]. Shared her team here would assist with placement at that time if needed. Plan to speak again tomorrow to further update on POC.  PMT to continue to follow for ongoing needs and support.  Objective:  Physical Exam Constitutional:      General: She is not in acute distress.    Appearance: She is ill-appearing.  Pulmonary:     Effort: No respiratory distress.  Skin:    General:  Skin is warm and dry.             Vital Signs: BP (!) 141/51 (BP Location: Left Arm)   Pulse 62   Temp 98.6 F (37 C) (Oral)   Resp 16   Ht '5\' 2"'$  (1.575 m)   Wt 60.2 kg   SpO2 97%   BMI 24.27 kg/m  SpO2: SpO2: 97 % O2 Device: O2 Device: Room Air O2 Flow Rate:     Palliative Care Assessment & Plan   Assessment/Recommendation/Plan  DNR/DNI Comfort Measures Utilize PRN Morphine, Glycopyrrolate, Haldol, Diazepam to maintain comfort PMT to continue to follow for ongoing needs and support  Thank you for allowing the Palliative Medicine Team to assist in the care of this patient.  Total time:  25 minutes  Time spent includes: Detailed review of medical records, medically appropriate exam (mental status, respiratory, cardiac, skin), discussed with treatment team, counseling and educating patient, family and staff, documenting clinical information, medication management and coordination of care.  Theodoro Grist, DNP, AGNP-C Palliative Medicine   Please contact Palliative Medicine Team phone at [phone] for questions and concerns.

## 2022-04-16 NOTE — Progress Notes (Signed)
Attempted to administer meds to patient and feed breakfast tray. This nurse and Mel Almond, NT present in room and patient swats at staff when touched. Incomprehensible speech, patient refused to turn off of left side at this time. Bed alarm remains in place, call bell within reach.

## 2022-04-16 NOTE — Progress Notes (Signed)
[place] [facility] [place] NAME: Sonya Bowman    MR#:  [id number]  DATE OF BIRTH:  December 26, 1930  SUBJECTIVE:  Resting No family at bedside Pt on comfort care   VITALS:  Blood pressure (!) 141/51, pulse 62, temperature 98.6 F (37 C), temperature source Oral, resp. rate 16, height '5\' 2"'$  (1.575 m), weight 60.2 kg, SpO2 97 %.  PHYSICAL EXAMINATION:  limited GENERAL:  89 y.o.-year-old patient with no acute distress.  LUNGS: Normal breath sounds bilaterally, no wheezing CARDIOVASCULAR: S1, S2 normal. No murmur    Assessment and Plan  Sonya Bowman is a 89 y.o. female with medical history significant of stroke, HTN, dementia, CKD-2, breast cancer (s/p of right mastectomy, chemo and radiation therapy), positive Covid 19 test at 7 days ago, who presented on 04/07/2022 for evaluation of generalized weakness and altered mental status. .  Pt unable to provide history or symptoms due to dementia.   Acute NSTEMI: Troponin was up to 7,152.  Not a candidate for cardiac cath  Paroxysmal atrial fibrillation, history of stroke  Acute metabolic encephalopathy, dementia with behavioral disturbance:   AKI on CKD stage II, hypernatremia  Severe sepsis secondary to acute UTI: Completed 3 days of IV ceftriaxone on 04/09/2022  Fecal impaction, stercoral colitis, constipation:   Elevated liver enzymes, hypokalemia  History of recent COVID-19 infection: Outside the window for antiviral therapy.   Poor oral intake, frailty, failure to thrive, frailty Nutrition Status: Nutrition Problem: Severe Malnutrition Etiology: social / environmental circumstances (advanced dementia) Signs/Symptoms: severe fat depletion, severe muscle depletion    Continue comfort care measures   CODE STATUS: DNR/DNI DVT Prophylaxis :comfort care Level of care: Med-Surg Status is: Inpatient Remains inpatient appropriate because: comfort care. Facility to eval tomorrow    TOTAL TIME  TAKING CARE OF THIS PATIENT: 25 minutes.  >50% time spent on counselling and coordination of care  Note: This dictation was prepared with Dragon dictation along with smaller phrase technology. Any transcriptional errors that result from this process are unintentional.  Fritzi Mandes M.D    [facility]   CC: Primary care physician; Pcp, No

## 2022-04-16 NOTE — Progress Notes (Signed)
[facility]) Hospice hospital liaison note   Patient referred for hospice services upon return to [facility].  [facility] Staff coming to [facility] to evaluate patient tomorrow.   Liaison continues to follow for discharge planning.    Thank you for the opportunity to participate in this patient's care.\  Dimas Aguas, RN Nurse Liaison [phone]

## 2022-04-17 DIAGNOSIS — Z7189 Other specified counseling: Secondary | ICD-10-CM | POA: Diagnosis not present

## 2022-04-17 DIAGNOSIS — E43 Unspecified severe protein-calorie malnutrition: Secondary | ICD-10-CM | POA: Diagnosis not present

## 2022-04-17 DIAGNOSIS — F0390 Unspecified dementia without behavioral disturbance: Secondary | ICD-10-CM | POA: Diagnosis not present

## 2022-04-17 DIAGNOSIS — I214 Non-ST elevation (NSTEMI) myocardial infarction: Secondary | ICD-10-CM | POA: Diagnosis not present

## 2022-04-17 NOTE — TOC Progression Note (Addendum)
Transition of Care [facility]) - Progression Note    Patient Details  Name: Sonya Bowman MRN: [id number] Date of Birth: 16-Jun-1930  Transition of Care [facility]) CM/SW [place], LCSW Phone Number: 04/17/2022, 8:57 AM  Clinical Narrative:  ALF staff has come to assess patient. They are requesting that [place] fax notes and FL2 for review and will contact CSW regarding when they can accept her back. They will also contact [facility] liaison to coordinate hospice admission at the facility. They stated patient would likely need a hospital bed and wheelchair. Updated team.   10:26 am: Faxed documents. Updated daughter.  2:42 pm: Tried Engineer, civil (consulting) at [employer] to make sure she received documents that were faxed over. No answer, unable to leave a voicemail.  3:52 pm: [employer] again with same result. Will try again in the morning.  Expected Discharge Plan and Services                                               Social Determinants of Health (SDOH) Interventions SDOH Screenings   Food Insecurity: Unknown (04/07/2022)  Housing: Low Risk  (04/07/2022)  Transportation Needs: Unknown (04/07/2022)  Utilities: Unknown (04/07/2022)  Tobacco Use: High Risk (04/08/2022)    Readmission Risk Interventions     No data to display

## 2022-04-17 NOTE — Progress Notes (Signed)
[place] at [place] NAME: Cyanni Kapoor    MR#:  [id number]  DATE OF BIRTH:  Apr 29, 1930  SUBJECTIVE:  Resting No family at bedside Pt on comfort care   VITALS:  Blood pressure (!) 124/42, pulse (!) 54, temperature 97.6 F (36.4 C), temperature source Oral, resp. rate 18, height '5\' 2"'$  (1.575 m), weight 60.2 kg, SpO2 96 %.  PHYSICAL EXAMINATION:  limited GENERAL:  89 y.o.-year-old patient with no acute distress.  LUNGS: Normal breath sounds bilaterally, no wheezing CARDIOVASCULAR: S1, S2 normal. No murmur    Assessment and Plan  YESENNIA MUSCAT is a 89 y.o. female with medical history significant of stroke, HTN, dementia, CKD-2, breast cancer (s/p of right mastectomy, chemo and radiation therapy), positive Covid 19 test at 7 days ago, who presented on 04/07/2022 for evaluation of generalized weakness and altered mental status. .  Pt unable to provide history or symptoms due to dementia.   Acute NSTEMI: Troponin was up to 7,152.  Not a candidate for cardiac cath  Paroxysmal atrial fibrillation, history of stroke  Acute metabolic encephalopathy, dementia with behavioral disturbance:   AKI on CKD stage II, hypernatremia  Severe sepsis secondary to acute UTI: Completed 3 days of IV ceftriaxone on 04/09/2022  Fecal impaction, stercoral colitis, constipation:   Elevated liver enzymes, hypokalemia  History of recent COVID-19 infection: Outside the window for antiviral therapy.   Poor oral intake, frailty, failure to thrive, frailty Nutrition Status: Nutrition Problem: Severe Malnutrition Etiology: social / environmental circumstances (advanced dementia) Signs/Symptoms: severe fat depletion, severe muscle depletion    Continue comfort care measures   CODE STATUS: DNR/DNI DVT Prophylaxis :comfort care Level of care: Med-Surg Status is: Inpatient Remains inpatient appropriate because: comfort care. Facility to eval tomorrow and inform  about d/c plans    TOTAL TIME TAKING CARE OF THIS PATIENT: 25 minutes.  >50% time spent on counselling and coordination of care  Note: This dictation was prepared with Dragon dictation along with smaller phrase technology. Any transcriptional errors that result from this process are unintentional.  Fritzi Mandes M.D    [facility]   CC: Primary care physician; Pcp, No

## 2022-04-17 NOTE — Progress Notes (Signed)
[facility] [facility]) Hospice hospital liaison note  Patient is referred for hospice services upon return to [facility]. Currently TOC waiting on confirmation of patient's ability to return.   Patient will likely need hospital bed and wheelchair for return and this will be ordered once her return to facility is confirmed.   [facility] liaison has attempted communication with facility as well and is waiting on response.   Thank you for the opportunity to participate in this patient's care  Jhonnie Garner, BSN, RN [facility] hospital liasion [phone]

## 2022-04-17 NOTE — Care Management Important Message (Signed)
Important Message  Patient Details  Name: Sonya Bowman MRN: [id number] Date of Birth: Feb 20, 1930   Medicare Important Message Given:  Other (see comment)  On comfort care measures.  Medicare IM withheld at this time out of respect for patient and family.   Dannette Barbara 04/17/2022, 1:33 PM

## 2022-04-17 NOTE — NC FL2 (Signed)
[place] LEVEL OF CARE FORM     IDENTIFICATION  Patient Name: Sonya Bowman Birthdate: 1930-10-14 Sex: female Admission Date (Current Location): 04/07/2022  [facility] and [place] Number:  Engineering geologist and Address:  [facility], [address], [place], [place] [address]      Provider Number: [id number]  Attending Physician Name and Address:  Fritzi Mandes, MD  Relative Name and Phone Number:       Current Level of Care: Hospital Recommended Level of Care: [place] (with hospice through [place]) Prior Approval Number:    Date Approved/Denied:   PASRR Number:    Discharge Plan: Other (Comment) (ALF with hospice through [facility] [place])    Current Diagnoses: Patient Active Problem List   Diagnosis Date Noted   Failure to thrive in adult 04/11/2022   Frailty 04/11/2022   Protein-calorie malnutrition, severe 04/09/2022   NSTEMI (non-ST elevated myocardial infarction) ([place]) 04/07/2022   Stroke ([place]) 04/07/2022   HTN (hypertension) 04/07/2022   Dementia ([place]) 04/07/2022   Acute renal failure superimposed on stage 2 chronic kidney disease ([place]) 04/07/2022   Atrial fibrillation, rapid ([place]) 04/07/2022   COVID-19 virus infection 04/07/2022   Hypernatremia 04/07/2022   Hypothermia 04/07/2022   Abnormal LFTs 0000000   Acute metabolic encephalopathy 0000000   Elevated lactic acid level 04/07/2022   Severe sepsis ([place]) 04/07/2022   UTI (urinary tract infection) 04/07/2022   Breast cancer, right ([place]) 05/11/2013   Breast cancer ([place]) 11/03/2012   Mass of breast, left 11/03/2012   Cancer (HCC)     Orientation RESPIRATION BLADDER Height & Weight      (Disoriented x 4)  Normal Incontinent, Indwelling catheter Weight: 132 lb 11.5 oz (60.2 kg) Height:  '5\' 2"'$  (157.5 cm)  BEHAVIORAL SYMPTOMS/MOOD NEUROLOGICAL BOWEL NUTRITION STATUS   (None)   Incontinent Diet (DYS 1)  AMBULATORY STATUS COMMUNICATION OF NEEDS  Skin   Total Care   Normal                       Personal Care Assistance Level of Assistance  Bathing, Feeding, Dressing Bathing Assistance: Maximum assistance Feeding assistance: Maximum assistance (Requires feeding) Dressing Assistance: Maximum assistance     Functional Limitations Info  Speech     Speech Info: Impaired (Incomprehensible)    SPECIAL CARE FACTORS FREQUENCY                       Contractures Contractures Info: Not present    Additional Factors Info  Code Status, Allergies Code Status Info: DNR Allergies Info: NKDA           Current Medications (04/17/2022):  This is the current hospital active medication list Current Facility-Administered Medications  Medication Dose Route Frequency Provider Last Rate Last Admin   acetaminophen (TYLENOL) tablet 650 mg  650 mg Oral Q6H PRN Jennye Boroughs, MD       Or   acetaminophen (TYLENOL) suppository 650 mg  650 mg Rectal Q6H PRN Jennye Boroughs, MD       antiseptic oral rinse (BIOTENE) solution 15 mL  15 mL Topical PRN Jennye Boroughs, MD       dextromethorphan-guaiFENesin (MUCINEX DM) 30-600 MG per 12 hr tablet 1 tablet  1 tablet Oral BID PRN Jennye Boroughs, MD       diazepam (VALIUM) injection 2.5 mg  2.5 mg Intravenous Q4H PRN Jennye Boroughs, MD   2.5 mg at 04/12/22 1432   divalproex (  DEPAKOTE SPRINKLE) capsule 125 mg  125 mg Oral BID Jennye Boroughs, MD   125 mg at 04/17/22 [id number]   glycopyrrolate (ROBINUL) tablet 1 mg  1 mg Oral Q4H PRN Jennye Boroughs, MD       Or   glycopyrrolate (ROBINUL) injection 0.2 mg  0.2 mg Subcutaneous Q4H PRN Jennye Boroughs, MD       Or   glycopyrrolate (ROBINUL) injection 0.2 mg  0.2 mg Intravenous Q4H PRN Jennye Boroughs, MD       haloperidol lactate (HALDOL) injection 2 mg  2 mg Intramuscular Q6H PRN Jennye Boroughs, MD   2 mg at 04/09/22 1117   loperamide (IMODIUM) capsule 2 mg  2 mg Oral BID PRN Jennye Boroughs, MD       morphine (PF) 2 MG/ML injection 1 mg  1 mg Intravenous  Q1H PRN Jennye Boroughs, MD       ondansetron (ZOFRAN-ODT) disintegrating tablet 4 mg  4 mg Oral Q6H PRN Jennye Boroughs, MD       Or   ondansetron (ZOFRAN) injection 4 mg  4 mg Intravenous Q6H PRN Jennye Boroughs, MD       Oral care mouth rinse  15 mL Mouth Rinse PRN Fritzi Mandes, MD       polyvinyl alcohol (LIQUIFILM TEARS) 1.4 % ophthalmic solution 1 drop  1 drop Both Eyes QID PRN Jennye Boroughs, MD       QUEtiapine (SEROQUEL) tablet 12.5 mg  12.5 mg Oral Q lunch Jennye Boroughs, MD   12.5 mg at 04/15/22 1358   QUEtiapine (SEROQUEL) tablet 25 mg  25 mg Oral QHS Jennye Boroughs, MD   25 mg at 04/16/22 2132     Discharge Medications: Please see discharge summary for a list of discharge medications.  Relevant Imaging Results:  Relevant Lab Results:   Additional Information SS#: [id number]  Candie Chroman, LCSW

## 2022-04-18 DIAGNOSIS — Z7189 Other specified counseling: Secondary | ICD-10-CM | POA: Diagnosis not present

## 2022-04-18 DIAGNOSIS — E43 Unspecified severe protein-calorie malnutrition: Secondary | ICD-10-CM | POA: Diagnosis not present

## 2022-04-18 DIAGNOSIS — F0390 Unspecified dementia without behavioral disturbance: Secondary | ICD-10-CM | POA: Diagnosis not present

## 2022-04-18 DIAGNOSIS — I214 Non-ST elevation (NSTEMI) myocardial infarction: Secondary | ICD-10-CM | POA: Diagnosis not present

## 2022-04-18 NOTE — Plan of Care (Signed)
?  Problem: Safety: ?Goal: Ability to remain free from injury will improve ?Outcome: Progressing ?  ?Problem: Skin Integrity: ?Goal: Risk for impaired skin integrity will decrease ?Outcome: Progressing ?  ?Problem: Safety: ?Goal: Non-violent Restraint(s) ?Outcome: Progressing ?  ?

## 2022-04-18 NOTE — Progress Notes (Signed)
Barrister's clerk Note  Referred for hospice services at [facility] upon hospital discharge.  Patient has been approved to go back to [facility].  Patient will need equipment delivered prior to discharge. Will evaluate Sunday for needed DME.  Thank you for the opportunity to participate in this patient's care.  Dimas Aguas, RN Nurse Liaison [phone]

## 2022-04-18 NOTE — Progress Notes (Signed)
[place] at [place] NAME: Sonya Bowman    MR#:  [id number]  DATE OF BIRTH:  1930-03-22  SUBJECTIVE:  Resting No family at bedside Pt on comfort care   VITALS:  Blood pressure (!) 160/79, pulse (!) 58, temperature (!) 97.4 F (36.3 C), resp. rate 14, height '5\' 2"'$  (1.575 m), weight 60.2 kg, SpO2 100 %.  PHYSICAL EXAMINATION:  limited GENERAL:  89 y.o.-year-old patient with no acute distress.  LUNGS: Normal breath sounds bilaterally, no wheezing CARDIOVASCULAR: S1, S2 normal. No murmur    Assessment and Plan  [name] is a 89 y.o. female with medical history significant of stroke, HTN, dementia, CKD-2, breast cancer (s/p of right mastectomy, chemo and radiation therapy), positive Covid 19 test at 7 days ago, who presented on 04/07/2022 for evaluation of generalized weakness and altered mental status. .  Pt unable to provide history or symptoms due to dementia.   Acute NSTEMI: Troponin was up to 7,152.  Not a candidate for cardiac cath  Paroxysmal atrial fibrillation, history of stroke  Acute metabolic encephalopathy, dementia with behavioral disturbance:   AKI on CKD stage II, hypernatremia  Severe sepsis secondary to acute UTI: Completed 3 days of IV ceftriaxone on 04/09/2022  Fecal impaction, stercoral colitis, constipation:   Elevated liver enzymes, hypokalemia  History of recent COVID-19 infection: Outside the window for antiviral therapy.   Poor oral intake, frailty, failure to thrive, frailty Nutrition Status: Nutrition Problem: Severe Malnutrition Etiology: social / environmental circumstances (advanced dementia) Signs/Symptoms: severe fat depletion, severe muscle depletion    Continue comfort care measures   CODE STATUS: DNR/DNI DVT Prophylaxis :comfort care Level of care: Med-Surg Status is: Inpatient Remains inpatient appropriate because: comfort care. Facility yet to inform about d/c plans    TOTAL TIME  TAKING CARE OF THIS PATIENT: 25 minutes.  >50% time spent on counselling and coordination of care  Note: This dictation was prepared with Dragon dictation along with smaller phrase technology. Any transcriptional errors that result from this process are unintentional.  Fritzi Mandes M.D    [facility]   CC: Primary care physician; Pcp, No

## 2022-04-18 NOTE — TOC Progression Note (Addendum)
Transition of Care [facility]) - Progression Note    Patient Details  Name: Sonya Bowman MRN: [id number] Date of Birth: [birth year]  Transition of Care [facility]) CM/SW [place], LCSW Phone Number: 04/18/2022, 10:28 AM  Clinical Narrative:   Left message with [facility] secretary for Catalina Lunger to call back.  12:45 pm: No call back so far. Called facility again and was told Zigmund Daniel should be back in about 30 minutes so to try calling back then.  1:44 pm: [employer] again. Was sent to her vm which is full. Tried Scientist, research (medical) and left a voicemail. Left message for ALF director asking for one of them to call me back.  2:20 pm: Per ALF director, they can accept patient back on Monday. Team and daughter are aware.  Expected Discharge Plan and Services                                               Social Determinants of Health (SDOH) Interventions SDOH Screenings   Food Insecurity: Unknown (04/07/2022)  Housing: Low Risk  (04/07/2022)  Transportation Needs: Unknown (04/07/2022)  Utilities: Unknown (04/07/2022)  Tobacco Use: High Risk (04/08/2022)    Readmission Risk Interventions     No data to display

## 2022-04-19 DIAGNOSIS — F0390 Unspecified dementia without behavioral disturbance: Secondary | ICD-10-CM | POA: Diagnosis not present

## 2022-04-19 DIAGNOSIS — Z7189 Other specified counseling: Secondary | ICD-10-CM | POA: Diagnosis not present

## 2022-04-19 DIAGNOSIS — E43 Unspecified severe protein-calorie malnutrition: Secondary | ICD-10-CM | POA: Diagnosis not present

## 2022-04-19 DIAGNOSIS — I214 Non-ST elevation (NSTEMI) myocardial infarction: Secondary | ICD-10-CM | POA: Diagnosis not present

## 2022-04-19 NOTE — Progress Notes (Signed)
[place] at [place] NAME: Sonya Bowman    MR#:  [id number]  DATE OF BIRTH:  01/05/1931  SUBJECTIVE:  Resting No family at bedside Pt on comfort care   VITALS:  Blood pressure (!) 149/48, pulse (!) 58, temperature 97.8 F (36.6 C), temperature source Oral, resp. rate 18, height '5\' 2"'$  (1.575 m), weight 60.2 kg, SpO2 100 %.  PHYSICAL EXAMINATION:  limited GENERAL:  89 y.o.-year-old patient with no acute distress.  LUNGS: Normal breath sounds bilaterally, no wheezing CARDIOVASCULAR: S1, S2 normal. No murmur    Assessment and Plan  Sonya Bowman is a 89 y.o. female with medical history significant of stroke, HTN, dementia, CKD-2, breast cancer (s/p of right mastectomy, chemo and radiation therapy), positive Covid 19 test at 7 days ago, who presented on 04/07/2022 for evaluation of generalized weakness and altered mental status. .  Pt unable to provide history or symptoms due to dementia.   Acute NSTEMI: Troponin was up to 7,152.  Not a candidate for cardiac cath  Paroxysmal atrial fibrillation, history of stroke  Acute metabolic encephalopathy, dementia with behavioral disturbance:   AKI on CKD stage II, hypernatremia  Severe sepsis secondary to acute UTI: Completed 3 days of IV ceftriaxone on 04/09/2022  Fecal impaction, stercoral colitis, constipation:   Elevated liver enzymes, hypokalemia  History of recent COVID-19 infection: Outside the window for antiviral therapy.   Poor oral intake, frailty, failure to thrive, frailty Nutrition Status: Nutrition Problem: Severe Malnutrition Etiology: social / environmental circumstances (advanced dementia) Signs/Symptoms: severe fat depletion, severe muscle depletion    Continue comfort care measures   CODE STATUS: DNR/DNI DVT Prophylaxis :comfort care Level of care: Med-Surg Status is: Inpatient Remains inpatient appropriate because: comfort care. Per TOC ALF will take pt back on  Monday with hospice   TOTAL TIME TAKING CARE OF THIS PATIENT: 25 minutes.  >50% time spent on counselling and coordination of care  Note: This dictation was prepared with Dragon dictation along with smaller phrase technology. Any transcriptional errors that result from this process are unintentional.  Fritzi Mandes M.D    [facility]   CC: Primary care physician; Pcp, No

## 2022-04-20 DIAGNOSIS — E43 Unspecified severe protein-calorie malnutrition: Secondary | ICD-10-CM | POA: Diagnosis not present

## 2022-04-20 DIAGNOSIS — I214 Non-ST elevation (NSTEMI) myocardial infarction: Secondary | ICD-10-CM | POA: Diagnosis not present

## 2022-04-20 DIAGNOSIS — Z7189 Other specified counseling: Secondary | ICD-10-CM | POA: Diagnosis not present

## 2022-04-20 DIAGNOSIS — F0390 Unspecified dementia without behavioral disturbance: Secondary | ICD-10-CM | POA: Diagnosis not present

## 2022-04-20 MED ORDER — CHLORHEXIDINE GLUCONATE CLOTH 2 % EX PADS
6.0000 | MEDICATED_PAD | Freq: Every day | CUTANEOUS | Status: DC
  Administered 2022-04-20 – 2022-04-21 (×3): 6 via TOPICAL

## 2022-04-20 NOTE — Progress Notes (Signed)
[place] at [place] NAME: Sonya Bowman    MR#:  [id number]  DATE OF BIRTH:  1930-05-20  SUBJECTIVE:  Resting Pt on comfort care   VITALS:  Blood pressure (!) 140/66, pulse (!) 108, temperature (!) 97.3 F (36.3 C), temperature source Axillary, resp. rate 12, height '5\' 2"'$  (1.575 m), weight 60.2 kg, SpO2 100 %.  PHYSICAL EXAMINATION:  limited GENERAL:  89 y.o.-year-old patient with no acute distress.  LUNGS: Normal breath sounds bilaterally, no wheezing CARDIOVASCULAR: S1, S2 normal. No murmur    Assessment and Plan  Sonya Bowman is a 89 y.o. female with medical history significant of stroke, HTN, dementia, CKD-2, breast cancer (s/p of right mastectomy, chemo and radiation therapy), positive Covid 19 test at 7 days ago, who presented on 04/07/2022 for evaluation of generalized weakness and altered mental status. .  Pt unable to provide history or symptoms due to dementia.   Acute NSTEMI: Troponin was up to 7,152.  Not a candidate for cardiac cath  Paroxysmal atrial fibrillation, history of stroke  Acute metabolic encephalopathy, dementia with behavioral disturbance:   AKI on CKD stage II, hypernatremia  Severe sepsis secondary to acute UTI: Completed 3 days of IV ceftriaxone on 04/09/2022  Fecal impaction, stercoral colitis, constipation:   Elevated liver enzymes, hypokalemia  History of recent COVID-19 infection: Outside the window for antiviral therapy.   Poor oral intake, frailty, failure to thrive, frailty Nutrition Status: Nutrition Problem: Severe Malnutrition Etiology: social / environmental circumstances (advanced dementia) Signs/Symptoms: severe fat depletion, severe muscle depletion    Continue comfort care measures   CODE STATUS: DNR/DNI DVT Prophylaxis :comfort care Level of care: Med-Surg Status is: Inpatient Remains inpatient appropriate because: comfort care. Per TOC ALF will take pt back on Monday with  hospice   TOTAL TIME TAKING CARE OF THIS PATIENT: 25 minutes.  >50% time spent on counselling and coordination of care  Note: This dictation was prepared with Dragon dictation along with smaller phrase technology. Any transcriptional errors that result from this process are unintentional.  Fritzi Mandes M.D    [facility]   CC: Primary care physician; Pcp, No

## 2022-04-21 DIAGNOSIS — R627 Adult failure to thrive: Secondary | ICD-10-CM | POA: Diagnosis not present

## 2022-04-21 DIAGNOSIS — F0390 Unspecified dementia without behavioral disturbance: Secondary | ICD-10-CM | POA: Diagnosis not present

## 2022-04-21 DIAGNOSIS — E43 Unspecified severe protein-calorie malnutrition: Secondary | ICD-10-CM | POA: Diagnosis not present

## 2022-04-21 MED ORDER — LORAZEPAM 0.5 MG PO TABS
0.5000 mg | ORAL_TABLET | ORAL | Status: DC | PRN
  Administered 2022-04-21: 0.5 mg via ORAL
  Filled 2022-04-21: qty 1

## 2022-04-21 MED ORDER — LORAZEPAM 0.5 MG PO TABS: 0.5000 mg | ORAL_TABLET | ORAL | 0 refills | Status: DC | PRN

## 2022-04-21 MED ORDER — GLYCOPYRROLATE 1 MG PO TABS: 1.0000 mg | ORAL_TABLET | ORAL | 0 refills | Status: DC | PRN

## 2022-04-21 MED ORDER — MORPHINE SULFATE (CONCENTRATE) 10 MG/0.5ML PO SOLN: 10.0000 mg | ORAL | 0 refills | Status: DC | PRN

## 2022-04-21 MED ORDER — POLYVINYL ALCOHOL 1.4 % OP SOLN: 1.0000 [drp] | Freq: Four times a day (QID) | OPHTHALMIC | 0 refills | Status: DC | PRN

## 2022-04-21 MED ORDER — MORPHINE SULFATE (CONCENTRATE) 10 MG/0.5ML PO SOLN: 10.0000 mg | ORAL | Status: DC | PRN

## 2022-04-21 NOTE — Progress Notes (Signed)
Manufacturing engineer [facility])  MSW spoke with daughter/Jaquline on this date to confirm DME needs.  Family requests the following equipment for delivery: Wheelchair Bed BST  MSW has requested a STAT delivery as goal for DC is today. Hortense Ramal is the family member to contact to arrange time of equipment delivery.    Please send signed and completed DNR home with patient/family. Please provide prescriptions at discharge as needed to ensure ongoing symptom management.    [facility] information and contact numbers given to family & above information shared with TOC.   Please call with any questions/concerns.    Thank you for the opportunity to participate in this patient's care.   Phillis Haggis, MSW [facility] Liaison

## 2022-04-21 NOTE — Discharge Summary (Addendum)
Physician Discharge Summary   Patient: Sonya Bowman MRN: [id number] DOB: 06-Mar-1930  Admit date:     04/07/2022  Discharge date: 04/21/22  Discharge Physician: Fritzi Mandes   PCP: Pcp, No   Recommendations at discharge:    F/u PCP as needed  Discharge Diagnoses: Principal Problem:   NSTEMI (non-ST elevated myocardial infarction) [facility]) Active Problems:   Atrial fibrillation, rapid ([place])   Stroke ([place])   HTN (hypertension)   Acute renal failure superimposed on stage 2 chronic kidney disease ([place])   COVID-19 virus infection   Severe sepsis ([place])   UTI (urinary tract infection)   Hypernatremia   Hypothermia   Acute metabolic encephalopathy   Abnormal LFTs   Dementia ([place])   Protein-calorie malnutrition, severe   Failure to thrive in adult   Frailty  Sonya Bowman is a 89 y.o. female with medical history significant of stroke, HTN, dementia, CKD-2, breast cancer (s/p of right mastectomy, chemo and radiation therapy), positive Covid 19 test at 7 days ago, who presented on 04/07/2022 for evaluation of generalized weakness and altered mental status. .  Pt unable to provide history or symptoms due to dementia.    Acute NSTEMI: Troponin was up to 7,152.  Not a candidate for cardiac cath  Paroxysmal atrial fibrillation, history of stroke  Acute metabolic encephalopathy, dementia with behavioral disturbance:   AKI on CKD stage II, hypernatremia  Severe sepsis secondary to acute UTI: Completed 3 days of IV ceftriaxone on 04/09/2022  Fecal impaction, stercoral colitis, constipation:   Elevated liver enzymes, hypokalemia  History of recent COVID-19 infection: Outside the window for antiviral therapy.   Poor oral intake, frailty, failure to thrive, frailty Nutrition Status: Nutrition Problem: Severe Malnutrition Etiology: social / environmental circumstances (advanced dementia) Signs/Symptoms: severe fat depletion, severe muscle depletion   Continue comfort care measures  Pt  will d/c to ALF with Hospice today   CODE STATUS: DNR/DNI DVT Prophylaxis :comfort care     Disposition: Assisted living Diet recommendation:  Discharge Diet Orders (From admission, onward)     Start     Ordered   04/21/22 0000  Diet - low sodium heart healthy        04/21/22 0958           Cardiac diet DISCHARGE MEDICATION: Allergies as of 04/21/2022   No Known Allergies      Medication List     STOP taking these medications    acetaminophen 325 MG tablet Commonly known as: TYLENOL   hydrALAZINE 10 MG tablet Commonly known as: APRESOLINE   losartan 25 MG tablet Commonly known as: Cozaar   Mucinex 600 MG 12 hr tablet Generic drug: guaiFENesin   TGT Psyllium Fiber 0.52 g capsule Generic drug: psyllium       TAKE these medications    divalproex 125 MG capsule Commonly known as: DEPAKOTE SPRINKLE Take 125 mg by mouth 2 (two) times daily.   glycopyrrolate 1 MG tablet Commonly known as: ROBINUL Take 1 tablet (1 mg total) by mouth every 4 (four) hours as needed (excessive secretions).   loperamide 2 MG tablet Commonly known as: IMODIUM A-D Take by mouth 2 (two) times daily as needed.   LORazepam 0.5 MG tablet Commonly known as: ATIVAN Take 1 tablet (0.5 mg total) by mouth every 4 (four) hours as needed for anxiety.   morphine CONCENTRATE 10 MG/0.5ML Soln concentrated solution Take 0.5 mLs (10 mg total) by mouth every 3 (three) hours as needed for severe pain.  polyvinyl alcohol 1.4 % ophthalmic solution Commonly known as: LIQUIFILM TEARS Place 1 drop into both eyes 4 (four) times daily as needed for dry eyes.   QUEtiapine 25 MG tablet Commonly known as: SEROQUEL Take 12.5 mg by mouth daily with lunch.   QUEtiapine 25 MG tablet Commonly known as: SEROQUEL Take 25 mg by mouth at bedtime.   Senna Plus 8.6-50 MG tablet Generic drug: senna-docusate Take 1 tablet by mouth at bedtime as needed.         Filed Weights   04/07/22 0855  04/07/22 2226 04/08/22 0747  Weight: 49 kg 59.5 kg 60.2 kg     Condition at discharge: poor  The results of significant diagnostics from this hospitalization (including imaging, microbiology, ancillary and laboratory) are listed below for reference.   Imaging Studies: CT ABDOMEN PELVIS WO CONTRAST  Result Date: 04/07/2022 CLINICAL DATA:  Abdominal pain. EXAM: CT ABDOMEN AND PELVIS WITHOUT CONTRAST TECHNIQUE: Multidetector CT imaging of the abdomen and pelvis was performed following the standard protocol without IV contrast. RADIATION DOSE REDUCTION: This exam was performed according to the departmental dose-optimization program which includes automated exposure control, adjustment of the mA and/or kV according to patient size and/or use of iterative reconstruction technique. COMPARISON:  None Available. FINDINGS: Evaluation of this exam is limited in the absence of intravenous contrast. Evaluation is also limited due to respiratory motion and streak artifact caused by patient's arms. Lower chest: Minimal bibasilar subpleural atelectasis. The visualized lung bases are otherwise clear. There is coronary vascular calcification and calcification of the mitral annulus. No intra-abdominal free air. No significant free fluid within the pelvis. Hepatobiliary: The liver is unremarkable. No biliary dilatation. There is layering sludge or small stones within the gallbladder. No pericholecystic fluid or evidence of acute cholecystitis by CT. Pancreas: Unremarkable. No pancreatic ductal dilatation or surrounding inflammatory changes. Spleen: Normal in size without focal abnormality. Adrenals/Urinary Tract: The adrenal glands are unremarkable. Nonobstructing bilateral renal calculi measure up to 6 mm in the inferior pole of the right kidney. There is no hydronephrosis on either side. The visualized ureters appear unremarkable. The urinary bladder is partially distended and grossly unremarkable. Stomach/Bowel: There is  large amount of stool throughout the colon. Large stool noted in the rectal vault suggestive of fecal impaction. There is stranding of the posterior perirectal fat concerning for developing stercoral colitis. Clinical correlation is recommended there is no bowel obstruction. The appendix is unremarkable as visualized. Vascular/Lymphatic: Advanced aortoiliac atherosclerotic disease. The IVC is unremarkable. No portal venous gas. There is no adenopathy. Reproductive: Several calcified uterine fibroids. A 4.6 x 4.6 cm partially calcified mass in the anterior right hemipelvis likely represents and pelvic fibroid. There is associated mass effect on the bladder. Other: None Musculoskeletal: Multilevel degenerative changes of the spine. Old compression fractures of the spine. No acute osseous pathology. IMPRESSION: 1. Constipation with findings suggestive of fecal impaction and developing stercoral colitis. No bowel obstruction. 2. Nonobstructing bilateral renal calculi. No hydronephrosis. 3. Cholelithiasis. 4. Calcified uterine fibroids. 5.  Aortic Atherosclerosis (ICD10-I70.0). Electronically Signed   By: Anner Crete M.D.   On: 04/07/2022 19:13   DG Tibia/Fibula Left  Result Date: 04/07/2022 CLINICAL DATA:  Left leg pain, deformity EXAM: LEFT TIBIA AND FIBULA - 2 VIEW COMPARISON:  None Available. FINDINGS: Frontal and lateral views of the left tibia and fibula are obtained. There are no acute displaced fractures. Alignment is anatomic. Joint spaces are well preserved. Soft tissues are unremarkable. IMPRESSION: 1. Unremarkable left tibia and fibula.  Electronically Signed   By: Randa Ngo M.D.   On: 04/07/2022 18:50   US Venous Img Lower Bilateral (DVT)  Result Date: 04/07/2022 CLINICAL DATA:  Positive D-dimer EXAM: Bilateral LOWER EXTREMITY VENOUS DOPPLER ULTRASOUND TECHNIQUE: Gray-scale sonography with compression, as well as color and duplex ultrasound, were performed to evaluate the deep venous  system(s) from the level of the common femoral vein through the popliteal and proximal calf veins. COMPARISON:  None Available. FINDINGS: VENOUS Normal compressibility of the common femoral, superficial femoral, and popliteal veins, as well as the visualized calf veins. Visualized portions of profunda femoral vein and great saphenous vein unremarkable. No filling defects to suggest DVT on grayscale or color Doppler imaging. Doppler waveforms show normal direction of venous flow, normal respiratory plasticity and response to augmentation. Limited views of the contralateral common femoral vein are unremarkable. OTHER None. Limitations: Patient positioning. Patient has dementia and had difficulty cooperating as per the sonographer Electronically Signed   By: Jill Side M.D.   On: 04/07/2022 17:49   NM Pulmonary Perfusion  Result Date: 04/07/2022 CLINICAL DATA:  Patient tested positive for COVID 10 days ago with generalized weakness and altered mental status in the setting of shortness of breath EXAM: NUCLEAR MEDICINE PERFUSION LUNG SCAN TECHNIQUE: Perfusion images were obtained in multiple projections after intravenous injection of radiopharmaceutical. Ventilation scans intentionally deferred if perfusion scan and chest x-ray adequate for interpretation during COVID 19 epidemic. RADIOPHARMACEUTICALS:  4.37 mCi Tc-64mMAA IV COMPARISON:  Chest radiograph dated 04/07/2022 FINDINGS: Mildly heterogeneous distribution of radiotracer with an area of decreased radiotracer distribution along the superior segment right lower lobe without definite wedge-shaped perfusion defect. IMPRESSION: Pulmonary embolism absent. Heterogeneous radiotracer distribution likely corresponds to sequela of recent COVID infection. Electronically Signed   By: LDarrin NipperM.D.   On: 04/07/2022 14:53   CT HEAD WO CONTRAST (5MM)  Result Date: 04/07/2022 CLINICAL DATA:  Weakness and altered mental status EXAM: CT HEAD WITHOUT CONTRAST TECHNIQUE:  Contiguous axial images were obtained from the base of the skull through the vertex without intravenous contrast. RADIATION DOSE REDUCTION: This exam was performed according to the departmental dose-optimization program which includes automated exposure control, adjustment of the mA and/or kV according to patient size and/or use of iterative reconstruction technique. COMPARISON:  06/15/2007 CT.  MRI 01/11/2019 FINDINGS: Brain: No focal abnormality is seen affecting the brainstem or cerebellum. Chronic small-vessel ischemic changes affect the pons. Cerebral hemispheres show old infarctions in the inferior right frontal lobe, right parietal lobe, superior left frontal lobe and superior left parietal lobe. These were all present in December of 2022, except the inferior right frontal stroke which is new since then but clearly old. No evidence of mass lesion, hemorrhage, hydrocephalus or extra-axial collection. Vascular: There is atherosclerotic calcification of the major vessels at the base of the brain. Skull: Negative Sinuses/Orbits: Widespread inflammatory opacification of the paranasal sinuses. Orbits negative. Other: None IMPRESSION: 1. No acute CT finding. Multiple old infarctions as outlined above. 2. Widespread inflammatory opacification of the paranasal sinuses. Electronically Signed   By: MNelson ChimesM.D.   On: 04/07/2022 14:48   DG Chest Port 1 View  Result Date: 04/07/2022 CLINICAL DATA:  Sepsis. EXAM: PORTABLE CHEST 1 VIEW COMPARISON:  October 02, 2015. FINDINGS: Stable cardiomediastinal silhouette. Left internal jugular Port-A-Cath is unchanged in position. Both lungs are clear. The visualized skeletal structures are unremarkable. IMPRESSION: No active disease. Aortic Atherosclerosis (ICD10-I70.0). Electronically Signed   By: JBobbe MedicoD.  On: 04/07/2022 08:56    Microbiology: Results for orders placed or performed during the hospital encounter of 04/07/22  Blood Culture (routine x 2)      Status: None   Collection Time: 04/07/22  8:42 AM   Specimen: BLOOD  Result Value Ref Range Status   Specimen Description BLOOD LEFT FA  Final   Special Requests   Final    BOTTLES DRAWN AEROBIC AND ANAEROBIC Blood Culture results may not be optimal due to an inadequate volume of blood received in culture bottles   Culture   Final    NO GROWTH 5 DAYS Performed at [facility], [address] [place], [place] [address]    Report Status 04/12/2022 FINAL  Final  Resp panel by RT-PCR (RSV, Flu A&B, Covid) Anterior Nasal Swab     Status: Abnormal   Collection Time: 04/07/22  8:55 AM   Specimen: Anterior Nasal Swab  Result Value Ref Range Status   SARS Coronavirus 2 by RT PCR POSITIVE (A) NEGATIVE Final    Comment: (NOTE) SARS-CoV-2 target nucleic acids are DETECTED.  The SARS-CoV-2 RNA is generally detectable in upper respiratory specimens during the acute phase of infection. Positive results are indicative of the presence of the identified virus, but do not rule out bacterial infection or co-infection with other pathogens not detected by the test. Clinical correlation with patient history and other diagnostic information is necessary to determine patient infection status. The expected result is Negative.  Fact Sheet for Patients: EntrepreneurPulse.com.au  Fact Sheet for Healthcare Providers: IncredibleEmployment.be  This test is not yet approved or cleared by the [place] FDA and  has been authorized for detection and/or diagnosis of SARS-CoV-2 by FDA under an Emergency Use Authorization (EUA).  This EUA will remain in effect (meaning this test can be used) for the duration of  the COVID-19 declaration under Section 564(b)(1) of the A ct, 21 U.S.C. section 360bbb-3(b)(1), unless the authorization is terminated or revoked sooner.     Influenza A by PCR NEGATIVE NEGATIVE Final   Influenza B by PCR NEGATIVE NEGATIVE Final     Comment: (NOTE) The Xpert Xpress SARS-CoV-2/FLU/RSV plus assay is intended as an aid in the diagnosis of influenza from Nasopharyngeal swab specimens and should not be used as a sole basis for treatment. Nasal washings and aspirates are unacceptable for Xpert Xpress SARS-CoV-2/FLU/RSV testing.  Fact Sheet for Patients: EntrepreneurPulse.com.au  Fact Sheet for Healthcare Providers: IncredibleEmployment.be  This test is not yet approved or cleared by the [place] FDA and has been authorized for detection and/or diagnosis of SARS-CoV-2 by FDA under an Emergency Use Authorization (EUA). This EUA will remain in effect (meaning this test can be used) for the duration of the COVID-19 declaration under Section 564(b)(1) of the Act, 21 U.S.C. section 360bbb-3(b)(1), unless the authorization is terminated or revoked.     Resp Syncytial Virus by PCR NEGATIVE NEGATIVE Final    Comment: (NOTE) Fact Sheet for Patients: EntrepreneurPulse.com.au  Fact Sheet for Healthcare Providers: IncredibleEmployment.be  This test is not yet approved or cleared by the [place] FDA and has been authorized for detection and/or diagnosis of SARS-CoV-2 by FDA under an Emergency Use Authorization (EUA). This EUA will remain in effect (meaning this test can be used) for the duration of the COVID-19 declaration under Section 564(b)(1) of the Act, 21 U.S.C. section 360bbb-3(b)(1), unless the authorization is terminated or revoked.  Performed at [facility], [address], [place], [place] [address]  Blood Culture (routine x 2)     Status: None   Collection Time: 04/07/22  9:04 AM   Specimen: BLOOD  Result Value Ref Range Status   Specimen Description BLOOD RIGTH [facility]  Final   Special Requests   Final    BOTTLES DRAWN AEROBIC AND ANAEROBIC Blood Culture results may not be optimal due to an inadequate volume of blood  received in culture bottles   Culture   Final    NO GROWTH 5 DAYS Performed at [facility], [address] [place], [place] [address]    Report Status 04/12/2022 FINAL  Final     Discharge time spent: greater than 30 minutes.  Signed: Fritzi Mandes, MD [facility] 04/21/2022

## 2022-04-21 NOTE — Plan of Care (Signed)
EMS transported patient to facility.  Foley was removed per facility request and MD order.RX sent with facility packet and alerted Maudie Mercury who took report for patient at facility.

## 2022-04-21 NOTE — NC FL2 (Signed)
[place] LEVEL OF CARE FORM     IDENTIFICATION  Patient Name: Sonya Bowman Birthdate: [birth year] Sex: female Admission Date (Current Location): 04/07/2022  [place] and [place] Number:  Engineering geologist and Address:  [facility], [address] [place], [place] [address]      Provider Number: [id number]  Attending Physician Name and Address:  Fritzi Mandes, MD  Relative Name and Phone Number:       Current Level of Care: Hospital Recommended Level of Care: Assisted Living Facility Prior Approval Number:    Date Approved/Denied:   PASRR Number:    Discharge Plan:  (ALF with hospice through [facility])    Current Diagnoses: Patient Active Problem List   Diagnosis Date Noted   Failure to thrive in adult 04/11/2022   Frailty 04/11/2022   Protein-calorie malnutrition, severe 04/09/2022   NSTEMI (non-ST elevated myocardial infarction) ([place]) 04/07/2022   Stroke ([place]) 04/07/2022   HTN (hypertension) 04/07/2022   Dementia ([place]) 04/07/2022   Acute renal failure superimposed on stage 2 chronic kidney disease ([place]) 04/07/2022   Atrial fibrillation, rapid ([place]) 04/07/2022   COVID-19 virus infection 04/07/2022   Hypernatremia 04/07/2022   Hypothermia 04/07/2022   Abnormal LFTs 0000000   Acute metabolic encephalopathy 0000000   Elevated lactic acid level 04/07/2022   Severe sepsis ([place]) 04/07/2022   UTI (urinary tract infection) 04/07/2022   Breast cancer, right ([place]) 05/11/2013   Breast cancer ([place]) 11/03/2012   Mass of breast, left 11/03/2012   Cancer (HCC)     Orientation RESPIRATION BLADDER Height & Weight     Self  Normal Incontinent, Indwelling catheter Weight: 60.2 kg Height:  '5\' 2"'$  (157.5 cm)  BEHAVIORAL SYMPTOMS/MOOD NEUROLOGICAL BOWEL NUTRITION STATUS   (None)   Incontinent Diet (low sodium heart healthy)  AMBULATORY STATUS COMMUNICATION OF NEEDS Skin   Total Care Verbally Normal                        Personal Care Assistance Level of Assistance  Bathing, Feeding, Dressing Bathing Assistance: Maximum assistance Feeding assistance: Maximum assistance Dressing Assistance: Maximum assistance     Functional Limitations Info  Speech     Speech Info: Impaired    SPECIAL CARE FACTORS FREQUENCY                       Contractures Contractures Info: Not present    Additional Factors Info  Code Status, Allergies Code Status Info: DNR Allergies Info: NKDA           Medication List       STOP taking these medications     acetaminophen 325 MG tablet Commonly known as: TYLENOL    hydrALAZINE 10 MG tablet Commonly known as: APRESOLINE    losartan 25 MG tablet Commonly known as: Cozaar    Mucinex 600 MG 12 hr tablet Generic drug: guaiFENesin    TGT Psyllium Fiber 0.52 g capsule Generic drug: psyllium           TAKE these medications     divalproex 125 MG capsule Commonly known as: DEPAKOTE SPRINKLE Take 125 mg by mouth 2 (two) times daily.    glycopyrrolate 1 MG tablet Commonly known as: ROBINUL Take 1 tablet (1 mg total) by mouth every 4 (four) hours as needed (excessive secretions).    loperamide 2 MG tablet Commonly known as: IMODIUM A-D Take by mouth 2 (two) times daily as needed.  LORazepam 0.5 MG tablet Commonly known as: ATIVAN Take 1 tablet (0.5 mg total) by mouth every 4 (four) hours as needed for anxiety.    morphine CONCENTRATE 10 MG/0.5ML Soln concentrated solution Take 0.5 mLs (10 mg total) by mouth every 3 (three) hours as needed for severe pain.    polyvinyl alcohol 1.4 % ophthalmic solution Commonly known as: LIQUIFILM TEARS Place 1 drop into both eyes 4 (four) times daily as needed for dry eyes.    QUEtiapine 25 MG tablet Commonly known as: SEROQUEL Take 12.5 mg by mouth daily with lunch.    QUEtiapine 25 MG tablet Commonly known as: SEROQUEL Take 25 mg by mouth at bedtime.    Senna Plus 8.6-50 MG tablet Generic  drug: senna-docusate Take 1 tablet by mouth at bedtime as needed.    Relevant Imaging Results:  Relevant Lab Results:   Additional Information SS#: [id number]  Beverly Sessions, RN

## 2022-04-21 NOTE — TOC Transition Note (Signed)
Transition of Care [facility]) - CM/SW Discharge Note   Patient Details  Name: Sonya Bowman MRN: [id number] Date of Birth: [birth year]  Transition of Care [facility]) CM/SW Contact:  Beverly Sessions, RN Phone Number: 04/21/2022, 3:17 PM   Clinical Narrative:    EMS transport called [facility] confirmed bed as been delivered Signed scripts in discharge packet          Patient Goals and CMS Choice      Discharge Placement                         Discharge Plan and Services Additional resources added to the After Visit Summary for                                       Social Determinants of Health (SDOH) Interventions SDOH Screenings   Food Insecurity: Unknown (04/07/2022)  Housing: Low Risk  (04/07/2022)  Transportation Needs: Unknown (04/07/2022)  Utilities: Unknown (04/07/2022)  Tobacco Use: High Risk (04/08/2022)     Readmission Risk Interventions     No data to display

## 2022-04-21 NOTE — TOC Transition Note (Signed)
Transition of Care [facility]) - CM/SW Discharge Note   Patient Details  Name: Sonya Bowman MRN: [id number] Date of Birth: January 02, 1931  Transition [facility]) CM/SW Contact:  Beverly Sessions, RN Phone Number: 04/21/2022, 1:54 PM   Clinical Narrative:     Patient to discharge to [facility] Today Confirmed with Maudie Mercury at [facility] that patient can return today after the hospital bed is delivered that [employer] has ordered [employer] and [place] with [employer] aware of discharge  Notified daughter [facility]  Bedside nurse give number to call report Per Maudie Mercury request put fl2 and DC summary in discharge packet  EMS packet and signed DNR on chart          Patient Goals and CMS Choice      Discharge Placement                         Discharge Plan and Services Additional resources added to the After Visit Summary for                                       Social Determinants of Health (SDOH) Interventions SDOH Screenings   Food Insecurity: Unknown (04/07/2022)  Housing: Low Risk  (04/07/2022)  Transportation Needs: Unknown (04/07/2022)  Utilities: Unknown (04/07/2022)  Tobacco Use: High Risk (04/08/2022)     Readmission Risk Interventions     No data to display

## 2022-04-21 NOTE — Care Management Important Message (Signed)
Important Message  Patient Details  Name: Sonya Bowman MRN: [id number] Date of Birth: 01/15/1931   Medicare Important Message Given:  Other (see comment)   On comfort care measures.  Medicare IM withheld at this time out of respect for patient and family.    Dannette Barbara 04/21/2022, 9:11 AM

## 2022-06-10 ENCOUNTER — Non-Acute Institutional Stay: Payer: Medicaid Other | Admitting: Hospice

## 2022-06-10 DIAGNOSIS — F03918 Unspecified dementia, unspecified severity, with other behavioral disturbance: Secondary | ICD-10-CM

## 2022-06-10 DIAGNOSIS — R451 Restlessness and agitation: Secondary | ICD-10-CM

## 2022-06-10 DIAGNOSIS — Z515 Encounter for palliative care: Secondary | ICD-10-CM

## 2022-06-10 DIAGNOSIS — R634 Abnormal weight loss: Secondary | ICD-10-CM

## 2022-06-10 NOTE — Progress Notes (Signed)
Therapist, nutritional Palliative Care Consult Note Telephone: [phone]  Fax: [phone]  PATIENT NAME: Sonya Bowman [address] [place] [address] [phone] (home) [phone] (work) DOB: Jan 13, 1931 MRN: [id number]  PRIMARY CARE PROVIDER:    Smiley Houseman NP   REFERRING PROVIDER:   Smiley Houseman NP   RESPONSIBLE PARTY:    Contact Information     Name Relation Home Work [place] Daughter [phone]  [phone]   Fox,Mitchell Relative   [phone]        I met face to face with patient in the facility. Visit to build trust and highlight Palliative Medicine as specialized medical care for people living with serious illness, aimed at facilitating better quality of life through symptoms relief, assisting with advance care planning and complex medical decision making.  NP called Adela Lank for update, and left her a voicemail with callback number.  Palliative care team will continue to support patient, patient's family, and medical team.  Patient recently revoked hospice ASSESSMENT AND / RECOMMENDATIONS:  ------------------------------------------------------------------------------------------------------------------------------------------------- Advance Care Planning: Our advance care planning conversation included a discussion about:    The value and importance of advance care planning  Difference between Hospice and Palliative care Exploration of goals of care in the event of a sudden injury or illness  Identification and preparation of a healthcare agent  Review and updating or creation of an  advance directive document . Decision not to resuscitate or to de-escalate disease focused treatments due to poor prognosis.  CODE STATUS:Patient is a Do Not Resuscitate  Goals of Care: Goals include to maximize quality of life and symptom management  I spent 20 minutes providing  this initial consultation. More than 50% of the time in this consultation was spent on counseling patient and coordinating communication. --------------------------------------------------------------------------------------------------------------------------------------  Symptom Management/Plan: Dementia: Advanced.  Ongoing memory loss/confusion, incontinence of bowel/bladder, limited language, impoverished thought in line with dementia disease trajectory. FAST 7A.  Abnormal weight loss: worsening.  Current weight 102 Ibs 05/13/22 down from 132.44 Ibs 04/17/22.  Offer assistance during meals to ensure adequate oral intake.  Ensure twice daily daily.  Check weight weekly x 4. Agitation: Continue Deparkote sprinkles, Seroquel, Lorazepam Routine CBC, CMP, Depakote level.  Follow up: Palliative care will continue to follow for complex medical decision making, advance care planning, and clarification of goals. Return 6 weeks or prn. Encouraged to call provider sooner with any concerns.   Family /Caregiver/Community Supports: Patient in memory care unit for ongoing care.  HOSPICE ELIGIBILITY/DIAGNOSIS: TBD  Chief Complaint: Initial Palliative care visit  HISTORY OF PRESENT ILLNESS:  Sonya Bowman is a 89 y.o. year old female  with multiple morbidities requiring close monitoring/management, and with high risk of complications and  mortality:  stroke, HTN, dementia, CKD-2, breast cancer (s/p of right mastectomy, chemo and radiation therapy), Covid 19  viral infection.  Patient denied pain/discomfort, not able to engage in meaningful discussion due to dementia, FLACC 0. History obtained from review of EMR, discussion with primary team, caregiver, family and/or Sonya Bowman.  Review and summarization of Epic records shows history from other than patient. Rest of 10 point ROS asked and negative. Independent interpretation of tests and reviewed as needed, available labs, patient records, imaging, studies and  related documents from the EMR.  PHYSICAL EXAM: GEN: Frail, in no acute distress  Cardiac: S1 S2, no LE edema feet/ankles  Respiratory:  clear to auscultation bilaterally, GI: soft, nontender,  +  BS   MS: Moving all extremities, no edema to BLE   Skin: warm and dry, no rash to visible skin Neuro: Generalized weakness, nonfocal, alert and oriented x 1 Psych: non-anxious affect   PAST MEDICAL HISTORY:  Active Ambulatory Problems    Diagnosis Date Noted   Cancer (HCC)    Breast cancer (HCC) 11/03/2012   Mass of breast, left 11/03/2012   Breast cancer, right (HCC) 05/11/2013   NSTEMI (non-ST elevated myocardial infarction) (HCC) 04/07/2022   Stroke (HCC) 04/07/2022   HTN (hypertension) 04/07/2022   Dementia (HCC) 04/07/2022   Acute renal failure superimposed on stage 2 chronic kidney disease (HCC) 04/07/2022   Atrial fibrillation, rapid (HCC) 04/07/2022   COVID-19 virus infection 04/07/2022   Hypernatremia 04/07/2022   Hypothermia 04/07/2022   Abnormal LFTs 04/07/2022   Acute metabolic encephalopathy 04/07/2022   Elevated lactic acid level 04/07/2022   Severe sepsis (HCC) 04/07/2022   UTI (urinary tract infection) 04/07/2022   Protein-calorie malnutrition, severe 04/09/2022   Failure to thrive in adult 04/11/2022   Frailty 04/11/2022   Resolved Ambulatory Problems    Diagnosis Date Noted   No Resolved Ambulatory Problems   Past Medical History:  Diagnosis Date   CKD (chronic kidney disease) stage 2, GFR 60-89 ml/min    Gum disease     SOCIAL HX:  Social History   Tobacco Use   Smoking status: Never   Smokeless tobacco: Current    Types: Snuff  Substance Use Topics   Alcohol use: No     FAMILY HX:  Family History  Problem Relation Age of Onset   Stroke Mother    Hypertension Mother    Diabetes Sister    Diabetes Brother    Hypertension Brother       ALLERGIES: No Known Allergies    PERTINENT MEDICATIONS:  Outpatient Encounter Medications as of  06/10/2022  Medication Sig   divalproex (DEPAKOTE SPRINKLE) 125 MG capsule Take 125 mg by mouth 2 (two) times daily.   glycopyrrolate (ROBINUL) 1 MG tablet Take 1 tablet (1 mg total) by mouth every 4 (four) hours as needed (excessive secretions).   loperamide (IMODIUM A-D) 2 MG tablet Take by mouth 2 (two) times daily as needed.   LORazepam (ATIVAN) 0.5 MG tablet Take 1 tablet (0.5 mg total) by mouth every 4 (four) hours as needed for anxiety.   Morphine Sulfate (MORPHINE CONCENTRATE) 10 MG/0.5ML SOLN concentrated solution Take 0.5 mLs (10 mg total) by mouth every 3 (three) hours as needed for severe pain.   polyvinyl alcohol (LIQUIFILM TEARS) 1.4 % ophthalmic solution Place 1 drop into both eyes 4 (four) times daily as needed for dry eyes.   QUEtiapine (SEROQUEL) 25 MG tablet Take 25 mg by mouth at bedtime.   QUEtiapine (SEROQUEL) 25 MG tablet Take 12.5 mg by mouth daily with lunch.   SENNA PLUS 8.6-50 MG tablet Take 1 tablet by mouth at bedtime as needed.   No facility-administered encounter medications on file as of 06/10/2022.     Thank you for the opportunity to participate in the care of Sonya Bowman.  The palliative care team will continue to follow. Please call our office at [phone] if we can be of additional assistance.   Note: Portions of this note were generated with Scientist, clinical (histocompatibility and immunogenetics). Dictation errors may occur despite best attempts at proofreading.  Rosaura Carpenter, NP

## 2022-12-12 DEATH — deceased
